# Patient Record
Sex: Female | Born: 1980 | Race: Black or African American | Hispanic: No | Marital: Married | Smoking: Never smoker
Health system: Southern US, Community
[De-identification: ages and names within clinical notes are randomized; demographics above are authoritative.]

## PROBLEM LIST (undated history)

## (undated) DIAGNOSIS — R7303 Prediabetes: Secondary | ICD-10-CM

## (undated) DIAGNOSIS — F419 Anxiety disorder, unspecified: Secondary | ICD-10-CM

## (undated) DIAGNOSIS — D649 Anemia, unspecified: Secondary | ICD-10-CM

## (undated) DIAGNOSIS — G8929 Other chronic pain: Secondary | ICD-10-CM

## (undated) DIAGNOSIS — M542 Cervicalgia: Secondary | ICD-10-CM

## (undated) DIAGNOSIS — I10 Essential (primary) hypertension: Secondary | ICD-10-CM

## (undated) DIAGNOSIS — G4730 Sleep apnea, unspecified: Secondary | ICD-10-CM

## (undated) DIAGNOSIS — M549 Dorsalgia, unspecified: Secondary | ICD-10-CM

## (undated) DIAGNOSIS — M1990 Unspecified osteoarthritis, unspecified site: Secondary | ICD-10-CM

## (undated) HISTORY — PX: BACK SURGERY: SHX140

## (undated) HISTORY — DX: Anemia, unspecified: D64.9

## (undated) HISTORY — DX: Sleep apnea, unspecified: G47.30

## (undated) HISTORY — PX: ABDOMINAL SURGERY: SHX537

---

## 2005-10-07 ENCOUNTER — Inpatient Hospital Stay (HOSPITAL_COMMUNITY): Admission: EM | Admit: 2005-10-07 | Discharge: 2005-10-09 | Payer: Self-pay | Admitting: Emergency Medicine

## 2005-10-07 ENCOUNTER — Ambulatory Visit: Payer: Self-pay | Admitting: Internal Medicine

## 2009-01-01 ENCOUNTER — Emergency Department (HOSPITAL_COMMUNITY): Admission: EM | Admit: 2009-01-01 | Discharge: 2009-01-01 | Payer: Self-pay | Admitting: Emergency Medicine

## 2009-03-06 ENCOUNTER — Encounter (HOSPITAL_COMMUNITY): Admission: RE | Admit: 2009-03-06 | Discharge: 2009-04-05 | Payer: Self-pay | Admitting: Preventative Medicine

## 2009-06-11 ENCOUNTER — Encounter: Admission: RE | Admit: 2009-06-11 | Discharge: 2009-06-11 | Payer: Self-pay | Admitting: Neurosurgery

## 2010-05-22 NOTE — Op Note (Signed)
Stacey Bray            ACCOUNT NO.:  [id number]   MEDICAL RECORD NO.:  [id number]          PATIENT TYPE:  INP   LOCATION:  A213                          FACILITY:  [facility]   PHYSICIAN:  Lionel December, M.D.    DATE OF BIRTH:  1980-08-03   DATE OF PROCEDURE:  10/08/2005  DATE OF DISCHARGE:                                 OPERATIVE REPORT   PROCEDURE:  Esophagogastroduodenoscopy followed by colonoscopy.   INDICATIONS:  Stacey Bray is a 30 year old African American female admitted with  weakness and postural symptoms and noted to be profoundly anemic with a  hemoglobin of 5.8 grams and low indices suspicious for iron deficiency.  She  had gynecologic evaluation by Dr. Emelda Fear.  These exams were negative,  although she has been having heavy periods.  She also has intermittent  hematochezia.  She is undergoing a GI evaluation to make sure she does not  have a GI source to account for her profound anemia, possibly iron  deficiency anemia (her iron studies are pending).   The procedure was reviewed with the patient, and informed consent was  obtained.   MEDICATIONS FOR CONSCIOUS SEDATION:  Benzocaine spray for oropharyngeal  topical anesthesia, Demerol 50 mg IV, Versed 8 mg IV in divided dose.   FINDINGS:  The procedure performed in the endoscopy suite.  The patient's  vital signs and O2 saturations were monitored during the procedure and  remained stable.   Procedure #1.  Esophagogastroduodenoscopy.  The patient was placed in the  left lateral recumbent position, and the Olympus videoscope was passed via  oropharynx without any difficulty into the esophagus.   Esophagus:  The mucosa of the esophagus was normal.  The GE junction was 40  cm from the incisors.   Stomach:  It was empty and distended very well with insufflation.  The folds  of proximal stomach were normal.  Examination of the mucosa at body, antrum,  pyloric channel, as well as angularis, fundus and cardia was  normal.   Duodenum:  The bulbar mucosa was normal.  The scope was passed into the  second part of the duodenum where mucosa and folds were normal.  The  endoscope was withdrawn.  The patient prepared for procedure #2.   Procedure #2.  Colonoscopy.  Rectal examination performed.  No abnormality  noted on external or digital exam.  The Olympus videoscope was placed into  the rectum and advanced under vision into the sigmoid colon and beyond.  The  preparation was excellent.  The scope was passed into the cecum which was  identified by the appendiceal orifice and ileocecal valve.  Pictures were  taken for the record.  As the scope was withdrawn, the colonic mucosa was  carefully examined and was normal throughout.  The rectal mucosa similarly  was normal.  The scope was retroflexed to examine the anorectal junction,  and hemorrhoids were noted below the dentate line.  The endoscope was  straightened and withdrawn.  The patient tolerated the procedure well.   FINAL DIAGNOSES:  1. Normal esophagogastroduodenoscopy.  2. Normal colonoscopy, except for external hemorrhoids which would explain  her intermittent hematochezia.   I suspect her anemia is secondary to chronic uterine blood loss.  Her iron  studies are pending.  Dr. Josefine Class has also requested hemoglobin  electrophoresis which is also pending.   RECOMMENDATIONS:  Presuming she has iron deficiency, she needs to be on  chronic iron therapy at least while she is having her periods.   Further GI evaluation will only be considered if she has documented iron  deficiency anemia which does not correct with iron therapy and/or she has  evidence of occult bleeding.      Lionel December, M.D.  Electronically Signed     NR/MEDQ  D:  10/08/2005  T:  10/10/2005  Job:  [id number]   cc:   Margaretmary Dys, M.D.

## 2010-05-22 NOTE — H&P (Signed)
[name], Bray            ACCOUNT NO.:  [id number]   MEDICAL RECORD NO.:  [id number]          PATIENT TYPE:  INP   LOCATION:  A213                          FACILITY:  [facility]   PHYSICIAN:  Margaretmary Dys, M.D.DATE OF BIRTH:  1980-11-11   DATE OF ADMISSION:  10/07/2005  DATE OF DISCHARGE:  LH                                HISTORY & PHYSICAL   PRIMARY CARE PHYSICIAN:  The patient is unassigned.   ADMITTING DIAGNOSES:  1. Generalized weakness.  2. Dizziness.  3. Severe anemia, hypochromic and microcytic.  4. Morbid obesity.   CHIEF COMPLAINT:  Generalized weakness and dizziness.   HISTORY OF PRESENT ILLNESS:  [name] is a 30 year old African-American  female with no significant past medical history.  She reports that she has  been feeling gradually dizzy over the past 2 days.  It was mostly when she  was standing or walking around.  She also describes symptoms of being  unsteady on her feet and feeling faint.  The patient attributed it to  perhaps just working very heard.  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  She did not  mention any chest pain.  No cough.  No fever or chills.  No night sweats.  No weight loss.  She denies any visual changes.  She has had no headaches at  all or photophobia.  She denies any leg swelling or pain in her lower  extremities.   The patient say her weakness and dizziness did not occur while at rest.  She  denies any blood per rectum or black stools.  She denies any hematemesis or  hemoptysis.  She has had no abdominal pain, nausea, vomiting, or diarrhea.  She is otherwise stable, other than the dizziness which she complained  above.  Evaluation in the emergency room, however, reveals that she was  severely anemic with no clear identifying factor.  She had a pelvic  ultrasound to rule out any vaginal or pelvic pathology, the report of which  is negative.  She also had evaluation by Dr. Emelda Fear who did not think  there was any concerning OB/GYN  issues.   The patient did tell me that over the past 3 months she thinks her menstrual  period has been a little heavier than usual.   She has seen more clots and also has changed her pad multiple times.   She has a 57-month-old baby.   REVIEW OF SYSTEMS:  A 10-point review of systems is otherwise negative with  no chest pain, no shortness of breath.   PAST MEDICAL HISTORY:  None.   MEDICATIONS:  None.   ALLERGIES:  No known drug allergies.   FAMILY HISTORY:  Only positive for hypertension.   SURGICAL HISTORY:  None of note.   SOCIAL HISTORY:  The patient does not smoke or drink alcohol.  Denies any  illicit drug use.  Her last menstrual period was September 22, 2005.  She  has 2 children, a 45 year old and a 11-month-old child.   She has always had a regular normal flow of menses until 3 months ago.   PHYSICAL EXAMINATION:  GENERAL:  Conscious, alert, comfortable, pleasant,  not in acute distress.  VITAL SIGNS:  On arrival here, blood pressure was 147/94, pulse of 86,  respirations 20, temperature 97.4, oxygen saturation was 98% on room air.  The patient weighed 141 kg.  HEENT EXAM:  Normocephalic, atraumatic.  Oral mucosa was moist with no  exudates.  NECK:  Supple.  No JVD or lymphadenopathy.  LUNGS:  Clear clinically with good air entry bilaterally.  HEART:  S1, S2 regular.  No S3, S4, gallops, or rubs.  ABDOMEN:  Obese but soft.  Not tender.  Bowel sounds positive.  No masses  palpable.  EXTREMITIES:  No pitting pedal edema.  No calf induration or tenderness was  noted.  PELVIC EXAM:  This was done by Dr. Emelda Fear.  Kindly refer to his consult  note.   LABORATORY/DIAGNOSTIC DATA:  White blood cell count was 6.1, hemoglobin of  5.8, hematocrit was 19.6, platelet count was 301, with no left shift.  MCV  was 56.7, MCHC was 29.5.  This is consistent with microcytic hypochromic  anemia.   She had a reticulocyte count of 2.3% with absolute reticulocytes of 79.6.   Sodium was 139, potassium 3.5, chloride of 107, CO2 of 27, glucose 95, BUN  of 4, creatinine of 0.8.  AST and ALT were normal.  Her liver function tests  were completely normal.  Albumin was 3.3, calcium of 8.6.  Urine pregnancy  test was negative.  Urinalysis was with some leukocytes and small epithelial  cells, 3 to 6 WBCs, and a few bacteria.  Pelvic ultrasound:  The report is  available electronically; there is a small ovarian cyst about 2.1 cm, and a  questionable tiny hemorrhagic rupture of the cyst on the right ovary about  1.3 cm in diameter.  There were no other pelvic abnormalities.   ASSESSMENT AND PLAN:  [name] is a 30 year old African-American female  who presents to the emergency room with generalized weakness and dizziness  and feeling faint.   Evaluation in the emergency room revealed that she had severe anemia with  hypochromic, microcytic.  The etiology is unclear but it clearly does point  in the direction of iron deficiency anemia, likely from chronic blood loss.   Plan is to admit her to 2A.  We will put her on telemetry.   We will obtain a GI consult as well already obtained an OB/GYN with Dr.  Emelda Fear.  The patient may need an endoscopy/colonoscopy.   We will do an anemia workup on her including a ferritin level, B12, and  folate level.  We will transfuse her with 3 units of packed red blood cells.   If all this preliminary workup is unremarkable including an endoscopy, the  patient may need hematology consult.   I think her symptoms of generalized weakness and dizziness likely are  related to the severe anemia.   Overall the patient is hemodynamically stable.  I have explained the above  plan to her and her fiance, who was present during this interview, and they  both verbalized a full understanding.      Margaretmary Dys, M.D.  Electronically Signed    AM/MEDQ  D:  10/08/2005  T:  10/08/2005  Job:  [id number]

## 2010-05-22 NOTE — Discharge Summary (Signed)
Stacey Bray, Stacey Bray            ACCOUNT NO.:  [id number]   MEDICAL RECORD NO.:  [id number]          PATIENT TYPE:  INP   LOCATION:  A213                          FACILITY:  [facility]   PHYSICIAN:  Margaretmary Dys, M.D.DATE OF BIRTH:  02/11/1980   DATE OF ADMISSION:  10/07/2005  DATE OF DISCHARGE:  10/06/2007LH                                 DISCHARGE SUMMARY   DISCHARGE DATE:  1. Generalized weakness.  2. Dizziness.  3. Severe hypochromic microcytic anemia.  4. Morbid obesity.   DISCHARGE MEDICATIONS:  Ferrous sulfate 325 mg p.o. b.i.d.   DIET:  Regular. The patient advised on weight loss and increased activity.   ACTIVITY:  As tolerated. The patient encouraged to get on weight loss as  mentioned above. No limitations.   SPECIAL INSTRUCTIONS:  The patient advised to call primary care physician if  she develops any melena stools or bright red blood in her stools.   FOLLOWUP:  1. Dr. Emelda Fear in about 3 to 4 weeks due to heavy menses.   CONSULTATIONS:  1. Dr. Karilyn Cota, Gastroenterology.  2. Dr. Emelda Fear, Obstetrics and Gynecology.   PROCEDURE:  EGD with colonoscopy on October 08, 2005. Indications for  procedure was severe anemia. Results of EGD and colonoscopy was completely  normal.   HOSPITAL COURSE:  She is a 30 year old African-American female with no  significant past medical history. She reports progressively generalized  weakness and dizziness, to the point of being unsteady on her feet and  feeling faint on the day of admission. She subsequently presented to the  emergency room where she was found to have severe anemia. The patient had a  pelvic ultrasound which ruled out any vaginal or pelvic pathology. She  reported heavy menstrual period over the past 6 months. The patient was  consulted by Dr. Emelda Fear, who did not think there were any significant OB-  GYN issues at this time. The patient was subsequently admitted to rule out  chronic occult blood loss from  the gastrointestinal tract. The patient was  subsequently seen by GI and she had an EGD and colonoscopy, results as  stated above. Overall, the patient did fairly well during the course of  hospitalization with no complications. She was hemodynamically stable  throughout. She did receive a total of 2 units of packed red blood cells and  her hemoglobin and hematocrit was stable throughout hospitalization. On the  day of discharge, it was 8.8 and 28.8. It was noted that her MCV on  admission was 56.7. There was also concern that the patient may have a  hemoglobinopathy, as her blood smear showed tear drop cells with  microcytosis and anisocytosis. Hemoglobin electrophoresis has been sent,  which is pending at the time of this dictation and will need to be followed  up.   LABORATORY DATA:  On admission white blood cell count 6.2. Hemoglobin and  hematocrit 5.8 and 19.6. Platelet count was 301,000 with no left shift. MCV  was 56.7. MCHC 29.5. She had a reticulocyte count of 2.3%  with absolute  reticulocytes of 79.6, sodium 139, potassium 3.5, chloride 107, CO2 27,  glucose  95, BUN 4, creatinine 0.8 and liver function tests were normal.  Albumin was 3.3, calcium 8.6. Urine pregnancy test was negative. A  urinalysis with some leukocytes and small epithelial cells.   Pelvic ultrasound shows a small ovarian cyst about 2.1 cm and a questionable  tiny hemorrhagic rupture of the right cyst. There were no other pelvic  abnormalities. Colonoscopy results were unremarkable.   DISPOSITION:  The patient is discharged home in satisfactory condition and  will need followup as mentioned above.      Margaretmary Dys, M.D.  Electronically Signed     AM/MEDQ  D:  10/24/2005  T:  10/24/2005  Job:  [id number]   cc:   Tilda Burrow, M.D.  Fax: [phone]

## 2010-05-22 NOTE — Consult Note (Signed)
NAMEJAILYN, LEESON            ACCOUNT NO.:  [id number]   MEDICAL RECORD NO.:  [id number]          PATIENT TYPE:  INP   LOCATION:  A213                          FACILITY:  [facility]   PHYSICIAN:  Lionel December, M.D.    DATE OF BIRTH:  27-Sep-1980   DATE OF CONSULTATION:  10/08/2005  DATE OF DISCHARGE:                                   CONSULTATION   REQUESTING PHYSICIAN:  [facility].   HISTORY OF PRESENT ILLNESS:  The patient is a 30 year old African-American  female who presented to the emergency department yesterday with complaints  of elevated blood pressure noted at work, headache and dizziness.  She  states she feels like she is going to pass out when she exerts herself.  She  thought maybe she was just working too hard.  Upon evaluation, she was found  to have a profound microcytic anemia with hemoglobin of 5.8, hematocrit of  19.6, MCV 56.7.  She underwent evaluation with pelvic ultrasound because of  recent onset of heavy menses for three months' duration.  She was found to  have small left ovarian cyst and questionable tiny hemorrhagic ruptured  right ovarian cyst; otherwise, ultrasound was unremarkable.  She apparently  was evaluated by GYN in the emergency department and it was felt that the  source of her microcytic anemia was not related to GYN sources.  The patient  states that she has a 75-month-old daughter.  At her six-week post-partum  check, she reports that she was anemic and was on iron for several months.  She is not sure if her hemoglobin ever returned to normal, however.  She has  been aware of being anemic in the past, but none quite so severe.  She has  been having regular menses pretty much since she gave birth to her daughter.  The last three months she has been having increased flow with clots, but her  cycles are generally once every 28-30 days.  She also has intermittent  hematochezia with small clots noted, but states it is only occasional and  she  thought it was from her hemorrhoids.  She has intermittent constipation,  denies any diarrhea, no abdominal pain, nausea or vomiting.  She has a  remote history of GERD, but really has not had much problem with this  lately.  No anorexia.  She denies any NSAIDs or aspirin use.  She takes  Tylenol 500 mg as needed for pain.  She was hemoccult negative in the  emergency department.   MEDICATIONS AT HOME:  Tylenol 500 mg p.r.n.Marland Kitchen   ALLERGIES:  She developed hives to Levaquin since admission.   PAST MEDICAL HISTORY:  Negative for chronic illnesses.  She has had some  benign subcutaneous cysts removed from  her abdomen.   FAMILY HISTORY:  Negative for chronic medical illnesses, peptic ulcer  disease, celiac disease, IVD, colorectal cancer.   SOCIAL HISTORY:  She is married.  She has a 32 year old and a 47-month-old.  She works a Event organiser.  She denies any tobacco, alcohol or illicit drug  use.   REVIEW OF SYSTEMS:  GI/GU:  See HPI.  CARDIOPULMONARY:  Denies any chest  pain or shortness of breath.  CONSTITUTIONAL:  See HPI.   PHYSICAL EXAMINATION:  VITAL SIGNS:  Temp 97.6, pulse 79, respirations 19,  blood pressure 160/90.  Weight 304.1 pounds, 67 inches.  GENERAL:  Pleasant, morbidly obese female in no acute distress.  SKIN:  Warm and dry, no jaundice.  HEENT:  Sclerae nonicteric.  Oropharyngeal mucosa moist and pink.  No  lymphadenopathy or thyromegaly.  CHEST:  Lungs clear to auscultation.  CARDIAC:  Regular rate and rhythm.  Normal S1, S2.  No murmurs, rubs or  gallops.  ABDOMEN:  Positive bowel sounds.  Obese but symmetrical.  Soft.  Nontender.  No organomegaly or masses appreciated, but limited due to body habitus.  No  rebound tenderness or guarding.  EXTREMITIES:  No edema.   LABS:  White count 6100, platelets 301,000, hemoglobin 5.8, hematocrit 19.6,  MCV 56.7, BUN 4, creatinine 0.8, potassium 3.5, glucose 95, albumin 3.3,  total bilirubin 23, alkaline phosphatase 84, AST  18, ALT 12, HCG negative.   IMPRESSION:  1. The patient is a 30 year old lady with profound microcytic anemia,      suspected to be due to iron deficiency anemia.  2. Differential diagnosis is quite broad:  She is postpartum 10 months.      She states that she had anemia at her 6-week postpartum checkup and was      on iron for several months.  It is unclear whether she was ever able to      gain a normal hemoglobin.  She does have intermittent hematochezia and      remote gastroesophageal reflux disease.  From a GI standpoint, we need      to exclude possibilities such as polyps, colorectal cancer,      arteriovenous malformations, irritable bowel disease, peptic ulcer      disease, celiac disease.   RECOMMENDATIONS:  Colonoscopy with possible EGD today.      Tana Coast, P.A.      Lionel December, M.D.  Electronically Signed    LL/MEDQ  D:  10/08/2005  T:  10/08/2005  Job:  [id number]

## 2010-06-30 ENCOUNTER — Other Ambulatory Visit (HOSPITAL_COMMUNITY)
Admission: RE | Admit: 2010-06-30 | Discharge: 2010-06-30 | Disposition: A | Discharge status: Home / Self Care | Payer: Self-pay | Source: Ambulatory Visit | Attending: Family Medicine | Admitting: Family Medicine

## 2010-06-30 ENCOUNTER — Other Ambulatory Visit (HOSPITAL_COMMUNITY)
Admission: RE | Admit: 2010-06-30 | Discharge: 2010-06-30 | Disposition: A | Discharge status: Home / Self Care | Payer: Self-pay | Source: Ambulatory Visit | Attending: Unknown Physician Specialty | Admitting: Unknown Physician Specialty

## 2010-06-30 ENCOUNTER — Other Ambulatory Visit: Payer: Self-pay | Admitting: Nurse Practitioner

## 2010-06-30 DIAGNOSIS — R87610 Atypical squamous cells of undetermined significance on cytologic smear of cervix (ASC-US): Secondary | ICD-10-CM | POA: Insufficient documentation

## 2010-06-30 DIAGNOSIS — R87619 Unspecified abnormal cytological findings in specimens from cervix uteri: Secondary | ICD-10-CM | POA: Insufficient documentation

## 2012-10-10 ENCOUNTER — Emergency Department (HOSPITAL_COMMUNITY): Payer: Self-pay

## 2012-10-10 ENCOUNTER — Encounter (HOSPITAL_COMMUNITY): Payer: Self-pay | Admitting: *Deleted

## 2012-10-10 ENCOUNTER — Emergency Department (HOSPITAL_COMMUNITY)
Admission: EM | Admit: 2012-10-10 | Discharge: 2012-10-10 | Disposition: A | Discharge status: Home / Self Care | Payer: Self-pay | Attending: Emergency Medicine | Admitting: Emergency Medicine

## 2012-10-10 DIAGNOSIS — M545 Low back pain, unspecified: Secondary | ICD-10-CM | POA: Insufficient documentation

## 2012-10-10 DIAGNOSIS — M542 Cervicalgia: Secondary | ICD-10-CM | POA: Insufficient documentation

## 2012-10-10 DIAGNOSIS — Z87828 Personal history of other (healed) physical injury and trauma: Secondary | ICD-10-CM | POA: Insufficient documentation

## 2012-10-10 DIAGNOSIS — Z9889 Other specified postprocedural states: Secondary | ICD-10-CM | POA: Insufficient documentation

## 2012-10-10 DIAGNOSIS — I10 Essential (primary) hypertension: Secondary | ICD-10-CM | POA: Insufficient documentation

## 2012-10-10 DIAGNOSIS — M5137 Other intervertebral disc degeneration, lumbosacral region: Secondary | ICD-10-CM | POA: Insufficient documentation

## 2012-10-10 DIAGNOSIS — M5136 Other intervertebral disc degeneration, lumbar region: Secondary | ICD-10-CM

## 2012-10-10 DIAGNOSIS — G8929 Other chronic pain: Secondary | ICD-10-CM | POA: Insufficient documentation

## 2012-10-10 DIAGNOSIS — M549 Dorsalgia, unspecified: Secondary | ICD-10-CM

## 2012-10-10 DIAGNOSIS — M51379 Other intervertebral disc degeneration, lumbosacral region without mention of lumbar back pain or lower extremity pain: Secondary | ICD-10-CM | POA: Insufficient documentation

## 2012-10-10 DIAGNOSIS — Z3202 Encounter for pregnancy test, result negative: Secondary | ICD-10-CM | POA: Insufficient documentation

## 2012-10-10 HISTORY — DX: Cervicalgia: M54.2

## 2012-10-10 HISTORY — DX: Essential (primary) hypertension: I10

## 2012-10-10 HISTORY — DX: Dorsalgia, unspecified: M54.9

## 2012-10-10 HISTORY — DX: Other chronic pain: G89.29

## 2012-10-10 MED ORDER — DIAZEPAM 5 MG/ML IJ SOLN
10.0000 mg | Freq: Once | INTRAMUSCULAR | Status: AC
  Administered 2012-10-10: 10 mg via INTRAMUSCULAR
  Filled 2012-10-10: qty 2

## 2012-10-10 MED ORDER — OXYCODONE-ACETAMINOPHEN 5-325 MG PO TABS: 1.0000 | ORAL_TABLET | ORAL | Status: DC | PRN

## 2012-10-10 MED ORDER — ONDANSETRON 8 MG PO TBDP
8.0000 mg | ORAL_TABLET | Freq: Once | ORAL | Status: AC
Start: 2012-10-10 — End: 2012-10-10
  Administered 2012-10-10: 8 mg via ORAL
  Filled 2012-10-10: qty 1

## 2012-10-10 MED ORDER — DIAZEPAM 10 MG PO TABS: 10.0000 mg | ORAL_TABLET | Freq: Four times a day (QID) | ORAL | Status: DC | PRN

## 2012-10-10 MED ORDER — HYDROMORPHONE HCL PF 2 MG/ML IJ SOLN
2.0000 mg | Freq: Once | INTRAMUSCULAR | Status: AC
  Administered 2012-10-10: 2 mg via INTRAMUSCULAR
  Filled 2012-10-10: qty 1

## 2012-10-10 NOTE — ED Notes (Signed)
Back pain from cervical region to lumbar worsening after awakening.  Has chronic back pain, but this is worse and radiates down both legs.  Hurts w/defication, no problems w/urination.  Denies fever, denies any injury.

## 2012-10-10 NOTE — ED Notes (Signed)
Discharge instructions given and reviewed with patient and boyfriend.  Prescriptions given for Percocet and Valium; effects and use explained.  Patient and significant other verbalized understanding of sedating effects of medications and to follow up with PMD as needed.  Patient discharged home in good condition; states pain is worse with movement.

## 2012-10-10 NOTE — ED Provider Notes (Signed)
CSN: [id number]     Arrival date & time 10/10/12  1652 History  This chart was scribed for Flint Melter, MD by Bennett Scrape, ED Scribe. This patient was seen in room APA14/AP[facility]14 and the patient's care was started at 6:39 PM.   Chief Complaint  Patient presents with  . Back Pain    The history is provided by the patient. No language interpreter was used.    HPI Comments: Stacey Bray is a 32 y.o. female who presents to the Emergency Department complaining of chronic back pain including her neck that flared up this morning. She reports that the back pain is felt diffusely and more severe than the neck pain currently. She states that the pain radiates down bilateral legs but is worse on the left side. She describes the pain as shooting and states that it goes past the knees. She rates her pain a 10 out of 10 currently. She admits that she has pain daily which she treats with pain medication, heating pads and OTC medications. She states that she also tries alternating sitting and standing to improve her symptoms. She reports that ambulation and twisting movements aggravate the pain. She reports a prior neck surgery 2 years done by Dr. Gerlene Fee in [place]. She denies chronic neck pain since the surgery. She denies seeing a spine specialist or other specialist for her back pain. She admits that she had x-rays of her lower back done 2 years ago at Dr. Trudee Grip office and x-rays at [facility] 1.5 years ago that showed DDD. 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  She denies bowel or urinary incontinence, other urinary symptoms or changes in her appetite. She denies taking any prescribed daily medications.  No PCP  Past Medical History  Diagnosis Date  . Hypertension   . Chronic neck pain   . Chronic back pain    Past Surgical History  Procedure Laterality Date  . Back surgery      cervical spine  . Abdominal surgery     History reviewed. No  pertinent family history. History  Substance Use Topics  . Smoking status: Never Smoker   . Smokeless tobacco: Not on file  . Alcohol Use: No   No OB history provided.  Review of Systems  Constitutional: Negative for appetite change.  Gastrointestinal:       No bowel incontinence   Genitourinary:       No urinary incontinence   Musculoskeletal: Positive for back pain (chronic).  Neurological: Negative for weakness and numbness.  All other systems reviewed and are negative.    Allergies  Review of patient's allergies indicates no known allergies.  Home Medications   Current Outpatient Rx  Name  Route  Sig  Dispense  Refill  . diazepam (VALIUM) 10 MG tablet   Oral   Take 1 tablet (10 mg total) by mouth every 6 (six) hours as needed (muscle spasm).   20 tablet   0   . oxyCODONE-acetaminophen (PERCOCET) 5-325 MG per tablet   Oral   Take 1 tablet by mouth every 4 (four) hours as needed for pain.   20 tablet   0     Triage Vitals: BP 157/101  Pulse 92  Temp(Src) 98.6 F (37 C) (Oral)  Resp 18  Ht 5\' 5"  (1.651 m)  Wt 298 lb (135.172 kg)  BMI 49.59 kg/m2  SpO2 99%  LMP 09/05/2012  Physical Exam  Nursing note and  vitals reviewed. Constitutional: She is oriented to person, place, and time. She appears well-developed and well-nourished.  HENT:  Head: Normocephalic and atraumatic.  Right Ear: External ear normal.  Left Ear: External ear normal.  Eyes: Conjunctivae and EOM are normal. Pupils are equal, round, and reactive to light.  Neck: Normal range of motion and phonation normal. Neck supple.  Cardiovascular: Normal rate, regular rhythm, normal heart sounds and intact distal pulses.   Pulmonary/Chest: Effort normal and breath sounds normal. She exhibits no bony tenderness.  Abdominal: Soft. Normal appearance. There is no tenderness.  Musculoskeletal: Normal range of motion.  Diffuse tenderness to entire spine and paraspinous muscles  Neurological: She is alert  and oriented to person, place, and time. No cranial nerve deficit or sensory deficit. She exhibits normal muscle tone. Coordination normal.  Skin: Skin is warm, dry and intact.  Psychiatric: She has a normal mood and affect. Her behavior is normal. Judgment and thought content normal.    ED Course  Procedures (including critical care time)  Medications  HYDROmorphone (DILAUDID) injection 2 mg (2 mg Intramuscular Given 10/10/12 1929)  diazepam (VALIUM) injection 10 mg (10 mg Intramuscular Given 10/10/12 1925)  ondansetron (ZOFRAN-ODT) disintegrating tablet 8 mg (8 mg Oral Given 10/10/12 1923)    Patient Vitals for the past 24 hrs:  BP Temp Temp src Pulse Resp SpO2 Height Weight  10/10/12 1709 157/101 mmHg 98.6 F (37 C) Oral 92 18 99 % 5\' 5"  (1.651 m) 298 lb (135.172 kg)    DIAGNOSTIC STUDIES: Oxygen Saturation is 99% on room air, normal by my interpretation.    COORDINATION OF CARE: 6:46 PM-Discussed treatment plan which includes medications and x-rays of the lumbar and thoracic spine with pt at bedside and pt agreed to plan.   8:55 PM-Pt rechecked and is sleeping soundly. She states that she feels improved with medications listed above. Informed pt of radiology results showing mild arthritis. Discussed discharge plan which includes medications with pt and pt agreed to plan. Also advised pt to follow up with a specialist as needed and pt agreed. Addressed symptoms to return for with pt.   Results for orders placed during the hospital encounter of 10/10/12  POCT PREGNANCY, URINE      Result Value Range   Preg Test, Ur NEGATIVE  NEGATIVE   Dg Thoracic Spine W/swimmers  10/10/2012   CLINICAL DATA:  Back pain.  EXAM: THORACIC SPINE - 2 VIEW + SWIMMERS  COMPARISON:  None.  FINDINGS: Lower cervical plate and screw fixator observed. Thoracic spondylosis is present with multilevel spurring anterior to the vertebral body column. No thoracic spine fracture or acute subluxation is observed.   IMPRESSION: 1. Thoracic spondylosis without radiographic findings of fracture or acute subluxation.   Electronically Signed   By: Herbie Baltimore M.D.   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 There is L5 spina bifida occulta.  Reduced intervertebral disc height at L5-S1, with spurring anterior to the vertebral body column at this level. No malalignment or lumbar spine fracture.  IMPRESSION: 1. Degenerative disk disease and spondylosis at L5-S1. 2. L5 spina bifida occulta. 3. 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   Electronically Signed   By: Herbie Baltimore M.D.   On: 10/10/2012 20:20     MDM   1. Back pain   2. Degenerative disc  disease, lumbar    Nonspecific back pain. She has minimal degenerative disc disease would only explain lower back pain. She is improved with treatment in the emergency department, in stable for discharge.  Nursing Notes Reviewed/ Care Coordinated, and agree without changes. Applicable Imaging Reviewed.  Interpretation of Laboratory Data incorporated into ED treatment   Plan: Home Medications- Percocet, Valium; Home Treatments and Observation- rest, heat; return here if the recommended treatment, does not improve the symptoms; Recommended follow up- PCP, when necessary    I personally performed the services described in this documentation, which was scribed in my presence. The recorded information has been reviewed and is accurate.      Flint Melter, MD 10/11/12 0010

## 2013-11-22 DIAGNOSIS — R7303 Prediabetes: Secondary | ICD-10-CM | POA: Insufficient documentation

## 2013-11-22 DIAGNOSIS — I10 Essential (primary) hypertension: Secondary | ICD-10-CM | POA: Insufficient documentation

## 2013-11-22 DIAGNOSIS — L732 Hidradenitis suppurativa: Secondary | ICD-10-CM | POA: Insufficient documentation

## 2014-01-04 HISTORY — PX: LAPAROSCOPIC GASTRIC SLEEVE RESECTION: SHX5895

## 2014-04-19 DIAGNOSIS — G4730 Sleep apnea, unspecified: Secondary | ICD-10-CM | POA: Insufficient documentation

## 2014-10-27 ENCOUNTER — Emergency Department (HOSPITAL_COMMUNITY)
Admission: EM | Admit: 2014-10-27 | Discharge: 2014-10-27 | Disposition: A | Discharge status: Home / Self Care | Payer: Medicaid Other | Attending: Emergency Medicine | Admitting: Emergency Medicine

## 2014-10-27 DIAGNOSIS — I10 Essential (primary) hypertension: Secondary | ICD-10-CM | POA: Diagnosis not present

## 2014-10-27 DIAGNOSIS — G8929 Other chronic pain: Secondary | ICD-10-CM | POA: Insufficient documentation

## 2014-10-27 DIAGNOSIS — H81399 Other peripheral vertigo, unspecified ear: Secondary | ICD-10-CM | POA: Diagnosis not present

## 2014-10-27 DIAGNOSIS — R42 Dizziness and giddiness: Secondary | ICD-10-CM | POA: Diagnosis present

## 2014-10-27 LAB — BASIC METABOLIC PANEL
ANION GAP: 7 (ref 5–15)
BUN: 9 mg/dL (ref 6–20)
CALCIUM: 9 mg/dL (ref 8.9–10.3)
CO2: 29 mmol/L (ref 22–32)
Chloride: 103 mmol/L (ref 101–111)
Creatinine, Ser: 0.98 mg/dL (ref 0.44–1.00)
Glucose, Bld: 91 mg/dL (ref 65–99)
Potassium: 3.4 mmol/L — ABNORMAL LOW (ref 3.5–5.1)
SODIUM: 139 mmol/L (ref 135–145)

## 2014-10-27 LAB — CBC WITH DIFFERENTIAL/PLATELET
Basophils Absolute: 0 K/uL (ref 0.0–0.1)
Basophils Relative: 0 %
Eosinophils Absolute: 0.1 K/uL (ref 0.0–0.7)
Eosinophils Relative: 2 %
HCT: 34.3 % — ABNORMAL LOW (ref 36.0–46.0)
Hemoglobin: 11.2 g/dL — ABNORMAL LOW (ref 12.0–15.0)
Lymphocytes Relative: 23 %
Lymphs Abs: 1.6 K/uL (ref 0.7–4.0)
MCH: 25.7 pg — ABNORMAL LOW (ref 26.0–34.0)
MCHC: 32.7 g/dL (ref 30.0–36.0)
MCV: 78.7 fL (ref 78.0–100.0)
Monocytes Absolute: 0.3 K/uL (ref 0.1–1.0)
Monocytes Relative: 5 %
Neutro Abs: 4.6 K/uL (ref 1.7–7.7)
Neutrophils Relative %: 70 %
Platelets: 162 K/uL (ref 150–400)
RBC: 4.36 MIL/uL (ref 3.87–5.11)
RDW: 16 % — ABNORMAL HIGH (ref 11.5–15.5)
WBC: 6.7 K/uL (ref 4.0–10.5)

## 2014-10-27 MED ORDER — LORAZEPAM 1 MG PO TABS: 1.0000 mg | ORAL_TABLET | Freq: Three times a day (TID) | ORAL | Status: DC | PRN

## 2014-10-27 MED ORDER — MECLIZINE HCL 25 MG PO TABS: 25.0000 mg | ORAL_TABLET | Freq: Three times a day (TID) | ORAL | Status: DC | PRN

## 2014-10-27 MED ORDER — MECLIZINE HCL 25 MG PO TABS
25.0000 mg | ORAL_TABLET | Freq: Once | ORAL | Status: AC
  Administered 2014-10-27: 25 mg via ORAL
  Filled 2014-10-27: qty 1

## 2014-10-27 MED ORDER — LORAZEPAM 1 MG PO TABS
1.0000 mg | ORAL_TABLET | Freq: Once | ORAL | Status: AC
  Administered 2014-10-27: 1 mg via ORAL
  Filled 2014-10-27: qty 1

## 2014-10-27 NOTE — Discharge Instructions (Signed)
Vertigo Vertigo means you feel like you or your surroundings are moving when they are not. Vertigo can be dangerous if it occurs when you are at work, driving, or performing difficult activities.  CAUSES  Vertigo occurs when there is a conflict of signals sent to your brain from the visual and sensory systems in your body. There are many different causes of vertigo, including:  Infections, especially in the inner ear.  A bad reaction to a drug or misuse of alcohol and medicines.  Withdrawal from drugs or alcohol.  Rapidly changing positions, such as lying down or rolling over in bed.  A migraine headache.  Decreased blood flow to the brain.  Increased pressure in the brain from a head injury, infection, tumor, or bleeding. SYMPTOMS  You may feel as though the world is spinning around or you are falling to the ground. Because your balance is upset, vertigo can cause nausea and vomiting. You may have involuntary eye movements (nystagmus). DIAGNOSIS  Vertigo is usually diagnosed by physical exam. If the cause of your vertigo is unknown, your caregiver may perform imaging tests, such as an MRI scan (magnetic resonance imaging). TREATMENT  Most cases of vertigo resolve on their own, without treatment. Depending on the cause, your caregiver may prescribe certain medicines. If your vertigo is related to body position issues, your caregiver may recommend movements or procedures to correct the problem. In rare cases, if your vertigo is caused by certain inner ear problems, you may need surgery. HOME CARE INSTRUCTIONS   Follow your caregiver's instructions.  Avoid driving.  Avoid operating heavy machinery.  Avoid performing any tasks that would be dangerous to you or others during a vertigo episode.  Tell your caregiver if you notice that certain medicines seem to be causing your vertigo. Some of the medicines used to treat vertigo episodes can actually make them worse in some people. SEEK  IMMEDIATE MEDICAL CARE IF:   Your medicines do not relieve your vertigo or are making it worse.  You develop problems with talking, walking, weakness, or using your arms, hands, or legs.  You develop severe headaches.  Your nausea or vomiting continues or gets worse.  You develop visual changes.  A family member notices behavioral changes.  Your condition gets worse. MAKE SURE YOU:  Understand these instructions.  Will watch your condition.  Will get help right away if you are not doing well or get worse.   This information is not intended to replace advice given to you by your health care provider. Make sure you discuss any questions you have with your health care provider.   Document Released: 09/30/2004 Document Revised: 03/15/2011 Document Reviewed: 04/15/2014 Elsevier Interactive Patient Education 2016 [place] tablets or capsules What is this medicine? MECLIZINE (MEK li zeen) is an antihistamine. It is used to prevent nausea, vomiting, or dizziness caused by motion sickness. It is also used to prevent and treat vertigo (extreme dizziness or a feeling that you or your surroundings are tilting or spinning around). This medicine may be used for other purposes; ask your health care provider or pharmacist if you have questions. What should I tell my health care provider before I take this medicine? They need to know if you have any of these conditions: -glaucoma -lung or breathing disease, like asthma -problems urinating -prostate disease -stomach or intestine problems -an unusual or allergic reaction to meclizine, other medicines, foods, dyes, or preservatives -pregnant or trying to get pregnant -breast-feeding How should I  use this medicine? °Take this medicine by mouth with a glass of water. Follow the directions on the prescription label. If you are using this medicine to prevent motion sickness, take the dose at least 1 hour before travel. If it upsets  your stomach, take it with food or milk. Take your doses at regular intervals. Do not take your medicine more often than directed. °Talk to your pediatrician regarding the use of this medicine in children. Special care may be needed. °Overdosage: If you think you have taken too much of this medicine contact a poison control center or emergency room at once. °NOTE: This medicine is only for you. Do not share this medicine with others. °What if I miss a dose? °If you miss a dose, take it as soon as you can. If it is almost time for your next dose, take only that dose. Do not take double or extra doses. °What may interact with this medicine? °Do not take this medicine with any of the following medications: °-MAOIs like Carbex, Eldepryl, Marplan, Nardil, and Parnate °This medicine may also interact with the following medications: °-alcohol °-antihistamines for allergy, cough and cold °-certain medicines for anxiety or sleep °-certain medicines for depression, like amitriptyline, fluoxetine, sertraline °-certain medicines for seizures like phenobarbital, primidone °-general anesthetics like halothane, isoflurane, methoxyflurane, propofol °-local anesthetics like lidocaine, pramoxine, tetracaine °-medicines that relax muscles for surgery °-narcotic medicines for pain °-phenothiazines like chlorpromazine, mesoridazine, prochlorperazine, thioridazine °This list may not describe all possible interactions. Give your health care provider a list of all the medicines, herbs, non-prescription drugs, or dietary supplements you use. Also tell them if you smoke, drink alcohol, or use illegal drugs. Some items may interact with your medicine. °What should I watch for while using this medicine? °Tell your doctor or healthcare professional if your symptoms do not start to get better or if they get worse. °You may get drowsy or dizzy. Do not drive, use machinery, or do anything that needs mental alertness until you know how this  medicine affects you. Do not stand or sit up quickly, especially if you are an older patient. This reduces the risk of dizzy or fainting spells. Alcohol may interfere with the effect of this medicine. Avoid alcoholic drinks. °Your mouth may get dry. Chewing sugarless gum or sucking hard candy, and drinking plenty of water may help. Contact your doctor if the problem does not go away or is severe. °This medicine may cause dry eyes and blurred vision. If you wear contact lenses you may feel some discomfort. Lubricating drops may help. See your eye doctor if the problem does not go away or is severe. °What side effects may I notice from receiving this medicine? °Side effects that you should report to your doctor or health care professional as soon as possible: °-feeling faint or lightheaded, falls °-fast, irregular heartbeat °Side effects that usually do not require medical attention (report these to your doctor or health care professional if they continue or are bothersome): °-constipation °-headache °-trouble passing urine or change in the amount of urine °-trouble sleeping °-upset stomach °This list may not describe all possible side effects. Call your doctor for medical advice about side effects. You may report side effects to FDA at 1-800-FDA-1088. °Where should I keep my medicine? °Keep out of the reach of children. °Store at room temperature between 15 and 30 degrees C (59 and 86 degrees F). Keep container tightly closed. Throw away any unused medicine after the expiration date. °NOTE: This sheet   is a summary. It may not cover all possible information. If you have questions about this medicine, talk to your doctor, pharmacist, or health care provider.    2016, Elsevier/Gold Standard. (2014-06-27 09:20:57)  Lorazepam tablets What is this medicine? LORAZEPAM (lor A ze pam) is a benzodiazepine. It is used to treat anxiety. This medicine may be used for other purposes; ask your health care provider or  pharmacist if you have questions. What should I tell my health care provider before I take this medicine? They need to know if you have any of these conditions: -alcohol or drug abuse problem -bipolar disorder, depression, psychosis or other mental health condition -glaucoma -kidney or liver disease -lung disease or breathing difficulties -myasthenia gravis -Parkinson's disease -seizures or a history of seizures -suicidal thoughts -an unusual or allergic reaction to lorazepam, other benzodiazepines, foods, dyes, or preservatives -pregnant or trying to get pregnant -breast-feeding How should I use this medicine? Take this medicine by mouth with a glass of water. Follow the directions on the prescription label. If it upsets your stomach, take it with food or milk. Take your medicine at regular intervals. Do not take it more often than directed. Do not stop taking except on the advice of your doctor or health care professional. Talk to your pediatrician regarding the use of this medicine in children. Special care may be needed. Overdosage: If you think you have taken too much of this medicine contact a poison control center or emergency room at once. NOTE: This medicine is only for you. Do not share this medicine with others. What if I miss a dose? If you miss a dose, take it as soon as you can. If it is almost time for your next dose, take only that dose. Do not take double or extra doses. What may interact with this medicine? -barbiturate medicines for inducing sleep or treating seizures, like phenobarbital -clozapine -medicines for depression, mental problems or psychiatric disturbances -medicines for sleep -phenytoin -probenecid -theophylline -valproic acid This list may not describe all possible interactions. Give your health care provider a list of all the medicines, herbs, non-prescription drugs, or dietary supplements you use. Also tell them if you smoke, drink alcohol, or use  illegal drugs. Some items may interact with your medicine. What should I watch for while using this medicine? Visit your doctor or health care professional for regular checks on your progress. Your body may become dependent on this medicine, ask your doctor or health care professional if you still need to take it. However, if you have been taking this medicine regularly for some time, do not suddenly stop taking it. You must gradually reduce the dose or you may get severe side effects. Ask your doctor or health care professional for advice before increasing or decreasing the dose. Even after you stop taking this medicine it can still affect your body for several days. You may get drowsy or dizzy. Do not drive, use machinery, or do anything that needs mental alertness until you know how this medicine affects you. To reduce the risk of dizzy and fainting spells, do not stand or sit up quickly, especially if you are an older patient. Alcohol may increase dizziness and drowsiness. Avoid alcoholic drinks. Do not treat yourself for coughs, colds or allergies without asking your doctor or health care professional for advice. Some ingredients can increase possible side effects. What side effects may I notice from receiving this medicine? Side effects that you should report to your doctor or health  care professional as soon as possible: -changes in vision -confusion -depression -mood changes, excitability or aggressive behavior -movement difficulty, staggering or jerky movements -muscle cramps -restlessness -weakness or tiredness Side effects that usually do not require medical attention (report to your doctor or health care professional if they continue or are bothersome): -constipation or diarrhea -difficulty sleeping, nightmares -dizziness, drowsiness -headache -nausea, vomiting This list may not describe all possible side effects. Call your doctor for medical advice about side effects. You may report  side effects to FDA at 1-800-FDA-1088. Where should I keep my medicine? Keep out of the reach of children. This medicine can be abused. Keep your medicine in a safe place to protect it from theft. Do not share this medicine with anyone. Selling or giving away this medicine is dangerous and against the law. This medicine may cause accidental overdose and death if taken by other adults, children, or pets. Mix any unused medicine with a substance like cat litter or coffee grounds. Then throw the medicine away in a sealed container like a sealed bag or a coffee can with a lid. Do not use the medicine after the expiration date. Store at room temperature between 20 and 25 degrees C (68 and 77 degrees F). Protect from light. Keep container tightly closed. NOTE: This sheet is a summary. It may not cover all possible information. If you have questions about this medicine, talk to your doctor, pharmacist, or health care provider.    2016, Elsevier/Gold Standard. (2013-09-11 15:24:21)

## 2014-10-27 NOTE — ED Notes (Signed)
States I was getting up for the morning.  Instantly felt dizzy when I sat up.  It felt like everything was turning and I fell back on the bed.  Still feels dizzy.

## 2014-10-27 NOTE — ED Notes (Signed)
MD at bedside. 

## 2014-10-27 NOTE — ED Provider Notes (Signed)
CSN: [id number]     Arrival date & time 10/27/14  4098 History   First MD Initiated Contact with Patient 10/27/14 0540     Chief Complaint  Patient presents with  . Dizziness     (Consider location/radiation/quality/duration/timing/severity/associated sxs/prior Treatment) Patient is a 34 y.o. female presenting with dizziness. The history is provided by the patient.  Dizziness She became dizzy and she woke up this morning and sat up. She fell back onto the bed but still feels dizzy. Dizziness is described as a spinning sensation. Nothing seems to make it better nothing seems to make it worse. There is no associated nausea. She denies ear pain or tinnitus or hearing loss. She's never had symptoms like this before.  Past Medical History  Diagnosis Date  . Hypertension   . Chronic neck pain   . Chronic back pain    Past Surgical History  Procedure Laterality Date  . Back surgery      cervical spine  . Abdominal surgery     No family history on file. Social History  Substance Use Topics  . Smoking status: Never Smoker   . Smokeless tobacco: Not on file  . Alcohol Use: No   OB History    No data available     Review of Systems  Neurological: Positive for dizziness.  All other systems reviewed and are negative.     Allergies  Review of patient's allergies indicates no known allergies.  Home Medications   Prior to Admission medications   Medication Sig Start Date End Date Taking? Authorizing Provider  diazepam (VALIUM) 10 MG tablet Take 1 tablet (10 mg total) by mouth every 6 (six) hours as needed (muscle spasm). 10/10/12   Mancel Bale, MD  oxyCODONE-acetaminophen (PERCOCET) 5-325 MG per tablet Take 1 tablet by mouth every 4 (four) hours as needed for pain. 10/10/12   Mancel Bale, MD   BP 184/89 mmHg  Pulse 66  Temp(Src) 98 F (36.7 C) (Oral)  Resp 17  Ht  (1.702 m)  Wt 270 lb (122.471 kg)  BMI 42.28 kg/m2  SpO2 99% Physical Exam  Nursing note and  vitals reviewed.  34 year old female, resting comfortably and in no acute distress. Vital signs are significant for hypertension. Oxygen saturation is 00%, which is normal. Head is normocephalic and atraumatic. PERRLA, EOMI. Oropharynx is clear. Neck is nontender and supple without adenopathy or JVD. Back is nontender and there is no CVA tenderness. Lungs are clear without rales, wheezes, or rhonchi. Chest is nontender. Heart has regular rate and rhythm without murmur. Abdomen is soft, flat, nontender without masses or hepatosplenomegaly and peristalsis is normoactive. Extremities have no cyanosis or edema, full range of motion is present. Skin is warm and dry without rash. Neurologic: Mental status is normal, cranial nerves are intact, there are no motor or sensory deficits. There is no nystagmus. Dizziness is reproduced by passive head movement.  ED Course  Procedures (including critical care time) Labs Review Results for orders placed or performed during the hospital encounter of 10/27/14  CBC with Differential  Result Value Ref Range   WBC 6.7 4.0 - 10.5 K/uL   RBC 4.36 3.87 - 5.11 MIL/uL   Hemoglobin 11.2 (L) 12.0 - 15.0 g/dL   HCT 11.9 (L) 14.7 - 82.9 %   MCV 78.7 78.0 - 100.0 fL   MCH 25.7 (L) 26.0 - 34.0 pg   MCHC 32.7 30.0 - 36.0 g/dL   RDW 56.2 (H) 13.0 - 86.5 %  Platelets PENDING 150 - 400 K/uL   Neutrophils Relative % 70 %   Neutro Abs 4.6 1.7 - 7.7 K/uL   Lymphocytes Relative 23 %   Lymphs Abs 1.6 0.7 - 4.0 K/uL   Monocytes Relative 5 %   Monocytes Absolute 0.3 0.1 - 1.0 K/uL   Eosinophils Relative 2 %   Eosinophils Absolute 0.1 0.0 - 0.7 K/uL   Basophils Relative 0 %   Basophils Absolute 0.0 0.0 - 0.1 K/uL  Basic metabolic panel  Result Value Ref Range   Sodium 139 135 - 145 mmol/L   Potassium 3.4 (L) 3.5 - 5.1 mmol/L   Chloride 103 101 - 111 mmol/L   CO2 29 22 - 32 mmol/L   Glucose, Bld 91 65 - 99 mg/dL   BUN 9 6 - 20 mg/dL   Creatinine, Ser 0.450.98 0.44 -  1.00 mg/dL   Calcium 9.0 8.9 - 40.910.3 mg/dL   GFR calc non Af Amer >60 >60 mL/min   GFR calc Af Amer >60 >60 mL/min   Anion gap 7 5 - 15   I have personally reviewed and evaluated these images and lab results as part of my medical decision-making.   MDM   Final diagnoses:  Peripheral vertigo, unspecified laterality    Dizziness which seems most consistent with peripheral vertigo. Screening labs are obtained and she will be given a dose of meclizine. Old records are reviewed, and there are no relevant past visits.  Following meclizine, there was modest improvement in her vertigo. Screening labs are unremarkable. She is discharged with prescription for meclizine and also for lorazepam. Return should symptoms worsen.  Stacey Bray Cynthya Yam, MD 10/27/14 [phone]

## 2014-10-30 DIAGNOSIS — L723 Sebaceous cyst: Secondary | ICD-10-CM | POA: Insufficient documentation

## 2014-10-30 DIAGNOSIS — Z9884 Bariatric surgery status: Secondary | ICD-10-CM | POA: Insufficient documentation

## 2016-12-18 ENCOUNTER — Emergency Department (HOSPITAL_COMMUNITY)
Admission: EM | Admit: 2016-12-18 | Discharge: 2016-12-18 | Disposition: A | Discharge status: Home / Self Care | Payer: Self-pay | Attending: Emergency Medicine | Admitting: Emergency Medicine

## 2016-12-18 ENCOUNTER — Emergency Department (HOSPITAL_COMMUNITY): Payer: Self-pay

## 2016-12-18 ENCOUNTER — Encounter (HOSPITAL_COMMUNITY): Payer: Self-pay

## 2016-12-18 DIAGNOSIS — Y9301 Activity, walking, marching and hiking: Secondary | ICD-10-CM | POA: Insufficient documentation

## 2016-12-18 DIAGNOSIS — W010XXA Fall on same level from slipping, tripping and stumbling without subsequent striking against object, initial encounter: Secondary | ICD-10-CM | POA: Insufficient documentation

## 2016-12-18 DIAGNOSIS — Y92481 Parking lot as the place of occurrence of the external cause: Secondary | ICD-10-CM | POA: Insufficient documentation

## 2016-12-18 DIAGNOSIS — Y998 Other external cause status: Secondary | ICD-10-CM | POA: Insufficient documentation

## 2016-12-18 DIAGNOSIS — S93402A Sprain of unspecified ligament of left ankle, initial encounter: Secondary | ICD-10-CM | POA: Insufficient documentation

## 2016-12-18 DIAGNOSIS — I10 Essential (primary) hypertension: Secondary | ICD-10-CM | POA: Insufficient documentation

## 2016-12-18 DIAGNOSIS — S83402A Sprain of unspecified collateral ligament of left knee, initial encounter: Secondary | ICD-10-CM | POA: Insufficient documentation

## 2016-12-18 DIAGNOSIS — S8992XA Unspecified injury of left lower leg, initial encounter: Secondary | ICD-10-CM | POA: Insufficient documentation

## 2016-12-18 MED ORDER — ACETAMINOPHEN 325 MG PO TABS
650.0000 mg | ORAL_TABLET | Freq: Once | ORAL | Status: AC
  Administered 2016-12-18: 650 mg via ORAL
  Filled 2016-12-18: qty 2

## 2016-12-18 MED ORDER — CYCLOBENZAPRINE HCL 10 MG PO TABS: 10.0000 mg | ORAL_TABLET | Freq: Two times a day (BID) | ORAL | 0 refills | Status: DC | PRN

## 2016-12-18 MED ORDER — ACETAMINOPHEN 500 MG PO TABS: 500.0000 mg | ORAL_TABLET | Freq: Four times a day (QID) | ORAL | 0 refills | Status: AC | PRN

## 2016-12-18 NOTE — ED Triage Notes (Signed)
Patient fell out on snow in yard today and complains of left knee and ankle pain, pain severe with any ambulation. No loc. Alert and oriented

## 2016-12-18 NOTE — ED Provider Notes (Signed)
[facility]   CSN: [id number] Arrival date & time: 12/18/16  1623     History   Chief Complaint Chief Complaint  Patient presents with  . fall/ knee and ankle injury    HPI Stacey Bray is a 36 y.o. female.  HPI   36 year old female with history of chronic pain presenting for evaluation of a recent fall.  Patient states prior to arrival, she was walking on the parking lot, slipped on wet ground and fell backward with her left leg bent backward.  She denies hitting her head or loss of consciousness.  She complained of severe pain throughout her left lower extremity from the knee down.  Pain is described as a burning sensation, severe, worse with movement.  She was able to ambulate with assist.  She denies any associated numbness.  No complaint of back pain or hip pain.  No specific treatment tried.  She denies any precipitating symptoms prior to the fall.  Past Medical History:  Diagnosis Date  . Chronic back pain   . Chronic neck pain   . Hypertension     There are no active problems to display for this patient.   Past Surgical History:  Procedure Laterality Date  . ABDOMINAL SURGERY    . BACK SURGERY     cervical spine    OB History    No data available       Home Medications    Prior to Admission medications   Medication Sig Start Date End Date Taking? Authorizing Provider  LORazepam (ATIVAN) 1 MG tablet Take 1 tablet (1 mg total) by mouth 3 (three) times daily as needed for anxiety (or dizziness). 10/27/14   Dione BoozeGlick, David, MD  meclizine (ANTIVERT) 25 MG tablet Take 1 tablet (25 mg total) by mouth 3 (three) times daily as needed for dizziness. 10/27/14   Dione BoozeGlick, David, MD    Family History No family history on file.  Social History Social History   Tobacco Use  . Smoking status: Never Smoker  . Smokeless tobacco: Never Used  Substance Use Topics  . Alcohol use: No  . Drug use: No      Allergies   Patient has no known allergies.   Review of Systems Review of Systems  All other systems reviewed and are negative.    Physical Exam Updated Vital Signs BP (!) 160/99 (BP Location: Right Arm)   Pulse 72   Temp 98.4 F (36.9 C) (Oral)   Resp 14   SpO2 100%   Physical Exam  Constitutional: She appears well-developed and well-nourished. No distress.  Obese female resting comfortably in bed in no acute discomfort.  HENT:  Head: Atraumatic.  Eyes: Conjunctivae are normal.  Neck: Neck supple.  Musculoskeletal: She exhibits tenderness (Left lower extremity: Tenderness throughout entire leg with gentle palpation however no overlying skin changes, leg compartment is soft, no deformity and no swelling.).  Neurological: She is alert.  Skin: Capillary refill takes less than 2 seconds. No rash noted.  Psychiatric: She has a normal mood and affect.  Nursing note and vitals reviewed.    ED Treatments / Results  Labs (all labs ordered are listed, but only abnormal results are displayed) Labs Reviewed - No data to display  EKG  EKG Interpretation None       Radiology Dg Ankle Complete Left  Result Date: 12/18/2016 CLINICAL DATA:  Pain post fall. EXAM: LEFT ANKLE COMPLETE - 3+ VIEW COMPARISON:  None.  FINDINGS: There is no evidence of fracture, dislocation, or joint effusion. There is no evidence of arthropathy or other focal bone abnormality. Mild lateral soft tissue swelling. IMPRESSION: No acute fracture or dislocation identified about the left ankle. Electronically Signed   By: Ted Mcalpineobrinka  Dimitrova M.D.   On: 12/18/2016 17:30   Dg Knee Complete 4 Views Left  Result Date: 12/18/2016 CLINICAL DATA:  Left knee pain post fall. EXAM: LEFT KNEE - COMPLETE 4+ VIEW COMPARISON:  None. FINDINGS: No evidence of fracture, dislocation, or joint effusion. Well corticated ossific fragments adjacent to the medial tibial spine, may be due to prior trauma or arthritic  changes. The joint spaces are preserved. IMPRESSION: No acute fracture or dislocation identified about the left knee. Electronically Signed   By: Ted Mcalpineobrinka  Dimitrova M.D.   On: 12/18/2016 17:29    Procedures Procedures (including critical care time)  Medications Ordered in ED Medications  acetaminophen (TYLENOL) tablet 650 mg (not administered)     Initial Impression / Assessment and Plan / ED Course  I have reviewed the triage vital signs and the nursing notes.  Pertinent labs & imaging results that were available during my care of the patient were reviewed by me and considered in my medical decision making (see chart for details).     BP (!) 160/99 (BP Location: Right Arm)   Pulse 72   Temp 98.4 F (36.9 C) (Oral)   Resp 14   SpO2 100%    Final Clinical Impressions(s) / ED Diagnoses   Final diagnoses:  Sprain of left ankle, unspecified ligament, initial encounter  Sprain of collateral ligament of left knee, initial encounter    ED Discharge Orders        Ordered    cyclobenzaprine (FLEXERIL) 10 MG tablet  2 times daily PRN     12/18/16 1802    acetaminophen (TYLENOL) 500 MG tablet  Every 6 hours PRN     12/18/16 1802     5:47 PM Patient had a mechanical fall and injured her left lower extremities.  She is complaining of pain from the knee down.  She has decreased range of motion however she does not have any obvious deformity and no evidence to suggest compartment syndrome.  She is neurovascularly intact.  X-ray of her left ankle and left knee without acute fractures or dislocation.  She does have some swelling noted to the lateral aspects of her left ankle.  ASO brace provided.  Likely a sprain.  Crutches provided.  Patient states she is allergic to NSAIDs.  Recommend rice therapy, Tylenol for pain.  Her blood pressure is elevated at 203/105 likely secondary to pain.  She does have history of hypertension.  On recheck BP improves to 160 systolic.  Recommend pt f/u with  PCP for bp recheck.  Ortho referral given as needed.  Return precaution discusse.    Fayrene Helperran, Shariah Assad, PA-C 12/18/16 Julio [id number]    Raeford RazorKohut, Stephen, MD 12/21/16 [phone]

## 2016-12-18 NOTE — Progress Notes (Signed)
Orthopedic Tech Progress Note Patient Details:  Stacey Bray 11/11/1980 [id number]  Ortho Devices Type of Ortho Device: ASO, Crutches Ortho Device/Splint Interventions: Application   Post Interventions Patient Tolerated: Well, Ambulated well Instructions Provided: Poper ambulation with device, Care of device, Adjustment of device   Saul FordyceJennifer C Brycelyn Bray 12/18/2016, 6:15 PM

## 2017-03-14 DIAGNOSIS — F331 Major depressive disorder, recurrent, moderate: Secondary | ICD-10-CM | POA: Insufficient documentation

## 2017-03-19 ENCOUNTER — Other Ambulatory Visit: Payer: Self-pay

## 2017-03-19 ENCOUNTER — Encounter (HOSPITAL_COMMUNITY): Payer: Self-pay | Admitting: Emergency Medicine

## 2017-03-19 ENCOUNTER — Emergency Department (HOSPITAL_COMMUNITY)
Admission: EM | Admit: 2017-03-19 | Discharge: 2017-03-19 | Disposition: A | Discharge status: Home / Self Care | Payer: Managed Care, Other (non HMO) | Attending: Emergency Medicine | Admitting: Emergency Medicine

## 2017-03-19 DIAGNOSIS — T464X5A Adverse effect of angiotensin-converting-enzyme inhibitors, initial encounter: Secondary | ICD-10-CM | POA: Insufficient documentation

## 2017-03-19 DIAGNOSIS — I10 Essential (primary) hypertension: Secondary | ICD-10-CM | POA: Insufficient documentation

## 2017-03-19 DIAGNOSIS — T783XXA Angioneurotic edema, initial encounter: Secondary | ICD-10-CM | POA: Diagnosis not present

## 2017-03-19 DIAGNOSIS — Z79899 Other long term (current) drug therapy: Secondary | ICD-10-CM | POA: Insufficient documentation

## 2017-03-19 LAB — I-STAT CHEM 8, ED
BUN: 8 mg/dL (ref 6–20)
CALCIUM ION: 1.17 mmol/L (ref 1.15–1.40)
CHLORIDE: 99 mmol/L — AB (ref 101–111)
Creatinine, Ser: 0.8 mg/dL (ref 0.44–1.00)
GLUCOSE: 94 mg/dL (ref 65–99)
HCT: 31 % — ABNORMAL LOW (ref 36.0–46.0)
Hemoglobin: 10.5 g/dL — ABNORMAL LOW (ref 12.0–15.0)
Potassium: 3.3 mmol/L — ABNORMAL LOW (ref 3.5–5.1)
Sodium: 140 mmol/L (ref 135–145)
TCO2: 28 mmol/L (ref 22–32)

## 2017-03-19 LAB — I-STAT BETA HCG BLOOD, ED (MC, WL, AP ONLY): I-stat hCG, quantitative: <5 m[IU]/mL (ref <5)

## 2017-03-19 MED ORDER — FAMOTIDINE IN NACL 20-0.9 MG/50ML-% IV SOLN
20.0000 mg | Freq: Once | INTRAVENOUS | Status: AC
Start: 2017-03-19 — End: 2017-03-19
  Administered 2017-03-19: 20 mg via INTRAVENOUS
  Filled 2017-03-19: qty 50

## 2017-03-19 MED ORDER — HYDROCHLOROTHIAZIDE 25 MG PO TABS: 25.0000 mg | ORAL_TABLET | Freq: Every day | ORAL | 0 refills | Status: DC

## 2017-03-19 MED ORDER — EPINEPHRINE 0.3 MG/0.3ML IJ SOAJ
0.3000 mg | Freq: Once | INTRAMUSCULAR | Status: AC
  Administered 2017-03-19: 0.3 mg via INTRAMUSCULAR
  Filled 2017-03-19: qty 0.3

## 2017-03-19 MED ORDER — DIPHENHYDRAMINE HCL 50 MG/ML IJ SOLN
25.0000 mg | Freq: Once | INTRAMUSCULAR | Status: AC
Start: 2017-03-19 — End: 2017-03-19
  Administered 2017-03-19: 25 mg via INTRAVENOUS
  Filled 2017-03-19: qty 1

## 2017-03-19 MED ORDER — METHYLPREDNISOLONE SODIUM SUCC 125 MG IJ SOLR
125.0000 mg | Freq: Once | INTRAMUSCULAR | Status: AC
Start: 2017-03-19 — End: 2017-03-19
  Administered 2017-03-19: 125 mg via INTRAVENOUS
  Filled 2017-03-19: qty 2

## 2017-03-19 NOTE — ED Notes (Signed)
MD at bedside. 

## 2017-03-19 NOTE — ED Notes (Signed)
EDP at bedside  

## 2017-03-19 NOTE — ED Provider Notes (Signed)
[facility]   CSN: [id number] Arrival date & time: 03/19/17  2056     History   Chief Complaint Chief Complaint  Patient presents with  . Angioedema    HPI Stacey Bray is a 37 y.o. female.  The history is provided by the patient.  Allergic Reaction  Presenting symptoms: swelling   Presenting symptoms: no difficulty breathing, no difficulty swallowing, no rash and no wheezing   Severity:  Moderate Duration:  7 hours Prior allergic episodes:  No prior episodes Context: medications (started lisinopril 68mo ago)   Relieved by:  Nothing Worsened by:  Nothing Ineffective treatments:  Antihistamines   Past Medical History:  Diagnosis Date  . Chronic back pain   . Chronic neck pain   . Hypertension     There are no active problems to display for this patient.   Past Surgical History:  Procedure Laterality Date  . ABDOMINAL SURGERY    . BACK SURGERY     cervical spine    OB History    No data available       Home Medications    Prior to Admission medications   Medication Sig Start Date End Date Taking? Authorizing Provider  acetaminophen (TYLENOL) 500 MG tablet Take 1 tablet (500 mg total) by mouth every 6 (six) hours as needed. Patient taking differently: Take 500 mg by mouth every 6 (six) hours as needed for mild pain or headache.  12/18/16  Yes Fayrene Helperran, Bowie, PA-C  amLODipine (NORVASC) 10 MG tablet Take 10 mg by mouth daily. 03/14/17  Yes [provider]  Biotin 10 MG TABS Take 10 mg by mouth 2 (two) times daily.   Yes [provider]  Calcium Carb-Cholecalciferol (CALCIUM + D3 PO) Take 1 tablet by mouth 2 (two) times daily.   Yes [provider]  Cholecalciferol (VITAMIN D-3) 1000 units CAPS Take 2,000 Units by mouth daily.   Yes [provider]  cyclobenzaprine (FLEXERIL) 10 MG tablet Take 1 tablet (10 mg total) by mouth 2 (two) times daily as needed for muscle spasms.  12/18/16  Yes Fayrene Helperran, Bowie, PA-C  diphenhydrAMINE (BENADRYL) 25 MG tablet Take 50 mg by mouth once as needed (for allergic reactions).   Yes [provider]  escitalopram (LEXAPRO) 5 MG tablet Take 5 mg by mouth at bedtime.  03/14/17  Yes [provider]  Multiple Vitamins-Calcium (ONE-A-DAY WOMENS PO) Take 1 tablet by mouth daily.   Yes [provider]  hydrochlorothiazide (HYDRODIURIL) 25 MG tablet Take 1 tablet (25 mg total) by mouth daily. 03/19/17 04/18/17  Dwana Melenaong, Biddie Sebek, DO  LORazepam (ATIVAN) 1 MG tablet Take 1 tablet (1 mg total) by mouth 3 (three) times daily as needed for anxiety (or dizziness). Patient not taking: Reported on 03/19/2017 10/27/14   Dione BoozeGlick, David, MD  meclizine (ANTIVERT) 25 MG tablet Take 1 tablet (25 mg total) by mouth 3 (three) times daily as needed for dizziness. Patient not taking: Reported on 03/19/2017 10/27/14   Dione BoozeGlick, David, MD    Family History History reviewed. No pertinent family history.  Social History Social History   Tobacco Use  . Smoking status: Never Smoker  . Smokeless tobacco: Never Used  Substance Use Topics  . Alcohol use: No  . Drug use: No     Allergies   Latex; Lisinopril; and Levofloxacin   Review of Systems Review of Systems  Constitutional: Negative for chills and fever.  HENT: Positive for facial swelling (upper  lip swelling). Negative for congestion, ear pain, sore throat and trouble swallowing.   Eyes: Negative for pain and visual disturbance.  Respiratory: Negative for cough, shortness of breath and wheezing.   Cardiovascular: Negative for chest pain and palpitations.  Gastrointestinal: Positive for nausea (resolved). Negative for abdominal pain, diarrhea and vomiting.  Genitourinary: Negative for dysuria and hematuria.  Musculoskeletal: Negative for back pain.  Skin: Negative for rash.  Neurological: Negative for light-headedness and headaches.  All other systems reviewed and are  negative.    Physical Exam Updated Vital Signs BP 123/60   Pulse 74   Temp 98.4 F (36.9 C) (Oral)   Resp 18   SpO2 100%   Physical Exam  Constitutional: She appears well-developed and well-nourished. No distress.  HENT:  Head: Normocephalic and atraumatic.    Eyes: Conjunctivae are normal.  Neck: Neck supple.  Cardiovascular: Normal rate and regular rhythm.  No murmur heard. Pulmonary/Chest: Effort normal and breath sounds normal. No respiratory distress.  Abdominal: Soft. There is no tenderness.  Musculoskeletal: She exhibits no edema.  Neurological: She is alert.  Skin: Skin is warm and dry.  Psychiatric: She has a normal mood and affect.  Nursing note and vitals reviewed.    ED Treatments / Results  Labs (all labs ordered are listed, but only abnormal results are displayed) Labs Reviewed  I-STAT CHEM 8, ED - Abnormal; Notable for the following components:      Result Value   Potassium 3.3 (*)    Chloride 99 (*)    Hemoglobin 10.5 (*)    HCT 31.0 (*)    All other components within normal limits  I-STAT BETA HCG BLOOD, ED ([facility], [facility], [facility] ONLY)    EKG  EKG Interpretation None       Radiology No results found.  Procedures Procedures (including critical care time)  Medications Ordered in ED Medications  diphenhydrAMINE (BENADRYL) injection 25 mg (25 mg Intravenous Given 03/19/17 2135)  EPINEPHrine (EPI-PEN) injection 0.3 mg (0.3 mg Intramuscular Given 03/19/17 2134)  methylPREDNISolone sodium succinate (SOLU-MEDROL) 125 mg/2 mL injection 125 mg (125 mg Intravenous Given 03/19/17 2135)  famotidine (PEPCID) IVPB 20 mg premix (0 mg Intravenous Stopped 03/19/17 2205)     Initial Impression / Assessment and Plan / ED Course  I have reviewed the triage vital signs and the nursing notes.  Pertinent labs & imaging results that were available during my care of the patient were reviewed by me and considered in my medical decision making (see chart for  details).     Patient is a 37 year old female with history of hypertension, chronic back and neck pain who presents with angioedema secondary to lisinopril use.  Patient was started on lisinopril 1 month ago.  She presents with 7 hours of worsening upper lip swelling and mild tongue swelling.  She denies any shortness of breath or difficulty swallowing.  She denies any current abdominal pain.  She did have some brief nausea when the symptoms first started, however that has since resolved.  She has not had any diarrhea.    On exam patient is breathing comfortably and has a moderate upper lip swelling.  Tongue appears mildly swollen but does not occlude the airway.  She has no stridor or wheezing.  Patient given IM epinephrine, IV Solu-Medrol, famotidine, Benadryl.  Patient symptoms slowly improving.  Patient monitored for over 2 hours with no worsening of symptoms.  Patient cleared for discharge home with strict return precautions.  Lisinopril is discontinued.  Patient  will be started on hydrochlorothiazide in addition to her current amlodipine for blood pressure control.  Patient to follow-up with her PCP early next week to discuss these changes.  Patient understands and agrees to plan.  Final Clinical Impressions(s) / ED Diagnoses   Final diagnoses:  Angiotensin converting enzyme inhibitor-aggravated angioedema, initial encounter    ED Discharge Orders        Ordered    hydrochlorothiazide (HYDRODIURIL) 25 MG tablet  Daily     03/19/17 2308       Dwana Melena, DO 03/19/17 2328    Gerhard Munch, MD 03/20/17 0001

## 2017-03-19 NOTE — ED Triage Notes (Signed)
Patient presents to ED with angioedema since 1400, with two doses of benadryl without relief.  Patient takes Lisinopril.  Patient just started taking escitolopram on 3/11.

## 2017-05-04 DIAGNOSIS — K641 Second degree hemorrhoids: Secondary | ICD-10-CM | POA: Insufficient documentation

## 2018-12-11 DIAGNOSIS — K912 Postsurgical malabsorption, not elsewhere classified: Secondary | ICD-10-CM | POA: Insufficient documentation

## 2018-12-18 ENCOUNTER — Encounter: Payer: Self-pay | Admitting: Gastroenterology

## 2019-01-22 ENCOUNTER — Other Ambulatory Visit: Payer: Self-pay | Admitting: Emergency Medicine

## 2019-01-23 ENCOUNTER — Other Ambulatory Visit: Payer: Self-pay

## 2019-01-23 ENCOUNTER — Encounter: Payer: Self-pay | Admitting: Gastroenterology

## 2019-01-23 ENCOUNTER — Ambulatory Visit (INDEPENDENT_AMBULATORY_CARE_PROVIDER_SITE_OTHER): Payer: Medicaid Other | Admitting: Gastroenterology

## 2019-01-23 VITALS — BP 154/92 | HR 72 | Temp 98.3°F | Ht 67.0 in | Wt 324.6 lb

## 2019-01-23 DIAGNOSIS — K625 Hemorrhage of anus and rectum: Secondary | ICD-10-CM | POA: Diagnosis not present

## 2019-01-23 DIAGNOSIS — D509 Iron deficiency anemia, unspecified: Secondary | ICD-10-CM | POA: Diagnosis not present

## 2019-01-23 DIAGNOSIS — Z9884 Bariatric surgery status: Secondary | ICD-10-CM

## 2019-01-23 MED ORDER — HYDROCORTISONE (PERIANAL) 2.5 % EX CREA: 1.0000 "application " | TOPICAL_CREAM | Freq: Two times a day (BID) | CUTANEOUS | 1 refills | Status: DC

## 2019-01-23 NOTE — Progress Notes (Signed)
Referring Provider: [facility] Primary Care Physician:  [facility]  Reason for Consultation:  Positive fecal occult blood test   IMPRESSION:  Iron deficiency, microcytic anemia with heme + stools Rectal bleeding with history of hemorrhoids s/p banding  In [place] Colonoscopy in [facility] 2017 Laproscopic gastric sleeve    - complaint with daily supplements including iron BMI 50 Mother with colon polyps  Iron deficiency may be related to the frequent and large volume of rectal bleeding. However, other causes must be considered, particularly given her history of gastric sleeve. However, she is complaint with her supplement including daily iron.  EGD and colonoscopy recommended for definitive diagnosis.   History of rectal bleeding attributed to hemorrhoids. These have been banded in the past. Will obtain prior records from previously colonoscopy and banding for confirmation. I have recommended a repeat colonoscopy as it has been 4 years, but, given the Covid restrictions and the need for this procedure to be performed at the hospital, will try to arrange for band ligation in the short term. Colonoscopy and EGD to evaluate for her anemia and heme + stools as soon as Covid restrictions are lifted for outpatient procedures.     PLAN: - Schedule hemorrhoidal banding - Anusol HC suppositories - Drink at least 64 ounces of water, eat a high fiber diet - Use a daily stool bulking agent such as Metamucil - EGD and colonoscopy at the hospital - Obtain colonoscopy report from 2017 and prior records from band ligation  The nature of the procedure, as well as the risks, benefits, and alternatives were carefully and thoroughly reviewed with the patient. Ample time for discussion and questions allowed. The patient understood, was satisfied, and agreed to proceed.  Please see the "Patient Instructions" section for addition details about the  plan.  HPI: Stacey Bray is a 39 y.o. female referred by NP Hemberg for further evaluation of positive fecal occult blood test. The history is obtained through the patient and review of her electronic health record. Previously evaluated and treated by GI in [place]. Those records are not available to me today. She has morbid obesity,  hypertension, prediabetes, depression/anxiety, sleep apnea and a history of laparoscopic sleeve gastrectomy.  She has intermittent rectal bleeding of a large volume of blood loss that occurs with most bowel movements over the last year. Often has to use a pad. There is an associated pain and rectal pressure.  She attributes this to hemorrhoids that have previously required band ligation. However, the volume of bleeding as increased.    She has a history of alternating diarrhea and constipation.  No rectal pain or fullness.  Stools are dark on iron supplements. No mucous in the stool.   Using Tucks pads, Benefiber daily, and cream. No prior use of suppositories and doesn't want to use them.  No symptoms associated with the anemia.  No fatigue, weakness, headache, irritability, exercise intolerance, exertional dyspnea, vertigo, or angina pectoris.  No pica.  No beeturia.  No hearing loss.  No epistaxis, vaginal bleeding, hemoptysis, or hematuria.   Previously evaluated by [facility] for band ligation. Most recently 2 years ago.  She had a colonoscopy in August 2017 with [facility]. No polyps found at that time.  No prior upper endoscopy.   She takes MVI, iron, vitamin D, and calcium citrate supplements every day. She denies the use NSAIDs.   Labs 10/25/18: normal liver enzymes with ALT 13, TSH 0.647, HgbA1c 5.8, Hgb 10.1,  MCV 71, platelets 237, ferritin 21 Labs 12/08/18: IFOB positive  Mother with colon polyps. No other known family history of colon cancer or polyps. No family history of uterine/endometrial cancer, pancreatic cancer or gastric/stomach  cancer.   Past Medical History:  Diagnosis Date  . Chronic back pain   . Chronic neck pain   . Hypertension     Past Surgical History:  Procedure Laterality Date  . ABDOMINAL SURGERY    . BACK SURGERY     cervical spine    Current Outpatient Medications  Medication Sig Dispense Refill  . acetaminophen (TYLENOL) 500 MG tablet Take 1 tablet (500 mg total) by mouth every 6 (six) hours as needed. (Patient taking differently: Take 500 mg by mouth every 6 (six) hours as needed for mild pain or headache. ) 30 tablet 0  . amLODipine (NORVASC) 10 MG tablet Take 10 mg by mouth daily.    . Biotin 10 MG TABS Take 10 mg by mouth 2 (two) times daily.    . bisoprolol-hydrochlorothiazide (ZIAC) 5-6.25 MG tablet Take 1 tablet by mouth daily.    . busPIRone (BUSPAR) 10 MG tablet Take 1 tablet by mouth 2 (two) times daily.    . Calcium Carb-Cholecalciferol (CALCIUM + D3 PO) Take 1 tablet by mouth 2 (two) times daily.    . Cholecalciferol (VITAMIN D-3) 1000 units CAPS Take 2,000 Units by mouth daily.    . clotrimazole-betamethasone (LOTRISONE) cream Apply to affected area 2 times daily    . cyclobenzaprine (FLEXERIL) 10 MG tablet Take 1 tablet (10 mg total) by mouth 2 (two) times daily as needed for muscle spasms. 20 tablet 0  . diphenhydrAMINE (BENADRYL) 25 MG tablet Take 50 mg by mouth once as needed (for allergic reactions).    Marland Kitchen escitalopram (LEXAPRO) 5 MG tablet Take 5 mg by mouth at bedtime.     . hydrochlorothiazide (HYDRODIURIL) 25 MG tablet Take 1 tablet (25 mg total) by mouth daily. 30 tablet 0  . LORazepam (ATIVAN) 1 MG tablet Take 1 tablet (1 mg total) by mouth 3 (three) times daily as needed for anxiety (or dizziness). (Patient not taking: Reported on 03/19/2017) 15 tablet 0  . meclizine (ANTIVERT) 25 MG tablet Take 1 tablet (25 mg total) by mouth 3 (three) times daily as needed for dizziness. (Patient not taking: Reported on 03/19/2017) 30 tablet 0  . Misc. Devices MISC Start CPAP at 10 cm.  water pressure.  CPAP machine with mask and supplies per patient preference.  ResMed AirFit P30i medium pillow mask for OSA.   Send to [facility] in [place].    . Multiple Vitamins-Calcium (ONE-A-DAY WOMENS PO) Take 1 tablet by mouth daily.     No current facility-administered medications for this visit.    Allergies as of 01/23/2019 - Review Complete 03/19/2017  Allergen Reaction Noted  . Latex Itching and Rash 11/26/2013  . Lisinopril Swelling 03/19/2017  . Levofloxacin Rash 04/29/2014    No family history on file.  Social History   Socioeconomic History  . Marital status: Married    Spouse name: Not on file  . Number of children: Not on file  . Years of education: Not on file  . Highest education level: Not on file  Occupational History  . Not on file  Tobacco Use  . Smoking status: Never Smoker  . Smokeless tobacco: Never Used  Substance and Sexual Activity  . Alcohol use: No  . Drug use: No  . Sexual activity: Yes  Birth control/protection: None  Other Topics Concern  . Not on file  Social History Narrative  . Not on file   Social Determinants of Health   Financial Resource Strain:   . Difficulty of Paying Living Expenses: Not on file  Food Insecurity:   . Worried About Charity fundraiser in the Last Year: Not on file  . Ran Out of Food in the Last Year: Not on file  Transportation Needs:   . Lack of Transportation (Medical): Not on file  . Lack of Transportation (Non-Medical): Not on file  Physical Activity:   . Days of Exercise per Week: Not on file  . Minutes of Exercise per Session: Not on file  Stress:   . Feeling of Stress : Not on file  Social Connections:   . Frequency of Communication with Friends and Family: Not on file  . Frequency of Social Gatherings with Friends and Family: Not on file  . Attends Religious Services: Not on file  . Active Member of Clubs or Organizations: Not on file  . Attends Archivist Meetings: Not on file  .  Marital Status: Not on file  Intimate Partner Violence:   . Fear of Current or Ex-Partner: Not on file  . Emotionally Abused: Not on file  . Physically Abused: Not on file  . Sexually Abused: Not on file    Review of Systems: 12 system ROS is negative except as noted above.   Physical Exam: General:   Alert,  well-nourished, pleasant and cooperative in NAD Head:  Normocephalic and atraumatic. Eyes:  Sclera clear, no icterus.   Conjunctiva pink. Abdomen:  Soft, nontender, nondistended, normal bowel sounds, no rebound or guarding. No hepatosplenomegaly.   Rectal:   No chemical dermatitis. No external hemorrhoids, fissure or fistula. No prolapsing hemorrhoids. Blood in the vault. Palpable, non-tender internal hemorrhoids. No rectal prolapse. Normal anocutaneous reflex. No stool in the rectal vault. No mass or fecal impaction. Normal anal resting tone. Chaperone: Desiree Msk:  Symmetrical. No boney deformities LAD: No inguinal or umbilical LAD Extremities:  No clubbing or edema. Neurologic:  Alert and  oriented x4;  grossly nonfocal Skin:  No obvious rash or bruise Psych:  Alert and cooperative. Normal mood and affect.     Aidden Markovic L. Tarri Glenn, MD, MPH 01/23/2019, 8:33 AM

## 2019-01-23 NOTE — Patient Instructions (Addendum)
-   Mygihealth.com and UpToDate.com have good information about hemorrhoids. - Use the Anusol HC and apply it to a glycerin suppository when you are having problems. Please do not use them every day for more than two weeks. Please call my if your symptoms are not responding to the Anusol.  - Continue to use Benefiber every day to insure soft, formed stools.  - I have recommended hemorrhoid banding with one of my partners. - Continue to take your iron supplements. Your body may absorb more iron if you use it every other day.  - When Covid restrictions are lifted, you should have a colonoscopy and upper endoscopy to further evaluate your anemia.

## 2019-02-05 ENCOUNTER — Encounter: Payer: Self-pay | Admitting: Gastroenterology

## 2019-02-05 ENCOUNTER — Ambulatory Visit (INDEPENDENT_AMBULATORY_CARE_PROVIDER_SITE_OTHER): Payer: Medicaid Other | Admitting: Gastroenterology

## 2019-02-05 ENCOUNTER — Other Ambulatory Visit: Payer: Self-pay

## 2019-02-05 VITALS — BP 130/70 | HR 73 | Temp 97.0°F | Ht 67.0 in | Wt 322.0 lb

## 2019-02-05 DIAGNOSIS — K641 Second degree hemorrhoids: Secondary | ICD-10-CM | POA: Diagnosis not present

## 2019-02-05 DIAGNOSIS — K602 Anal fissure, unspecified: Secondary | ICD-10-CM

## 2019-02-05 MED ORDER — AMBULATORY NON FORMULARY MEDICATION: 1 refills | Status: DC

## 2019-02-05 NOTE — Patient Instructions (Addendum)
We have sent a prescription for nitroglycerin 0.125% gel to [facility]. You should apply a pea size amount to your rectum three times daily x 6-8 weeks.  [facility]'s information is below: Address: [address] [place], [place] [address]  [phone]  *Please DO NOT go directly from our office to pick up this medication! Give the pharmacy 1 day to process the prescription as this is compounded and takes time to make.   Anal Fissure, Adult  An anal fissure is a small tear or crack in the tissue around the opening of the butt (anus). Bleeding from the tear or crack usually stops on its own within a few minutes. The bleeding may happen every time you poop (have a bowel movement) until the tear or crack heals. What are the causes? This condition is usually caused by passing a large or hard poop (stool). Other causes include:  Trouble pooping (constipation).  Passing watery poop (diarrhea).  Inflammatory bowel disease (Crohn's disease or ulcerative colitis).  Childbirth.  Infections.  Anal sex. What are the signs or symptoms? Symptoms of this condition include:  Bleeding from the butt.  Small amounts of blood on your poop. The blood coats the outside of the poop. It is not mixed with the poop.  Small amounts of blood on the toilet paper or in the toilet after you poop.  Pain when passing poop.  Itching or irritation around the opening of the butt. How is this diagnosed? This condition may be diagnosed based on a physical exam. Your doctor may:  Check your butt. A tear can often be seen by checking the area with care.  Check your butt using a short tube (anoscope). The light in the tube will show any problems in your butt. How is this treated? Treatment for this condition may include:  Treating problems that make it hard for you to pass poop. You may be told to: ? Eat more fiber. ? Drink more fluid. ? Take fiber supplements. ? Take medicines  that make poop soft.  Taking sitz baths. This may help to heal the tear.  Using creams and ointments. If your condition gets worse, other treatments may be needed such as:  A shot near the tear or crack (botulinum injection).  Surgery to repair the tear or crack. Follow these instructions at home: Eating and drinking   Avoid bananas and dairy products. These foods can make it hard to poop.  Drink enough fluid to keep your pee (urine) pale yellow.  Eat foods that have a lot of fiber in them, such as: ? Beans. ? Whole grains. ? Fresh fruits. ? Fresh vegetables. General instructions   Take over-the-counter and prescription medicines only as told by your doctor.  Use creams or ointments only as told by your doctor.  Keep the butt area as clean and dry as you can.  Take a warm water bath (sitz bath) as told by your doctor. Do not use soap.  Keep all follow-up visits as told by your doctor. This is important. Contact a doctor if:  You have more bleeding.  You have a fever.  You have watery poop that is mixed with blood.  You have pain.  Your problem gets worse, not better. Summary  An anal fissure is a small tear or crack in the skin around the opening of the butt (anus).  This condition is usually caused by passing a large or hard poop (stool).  Treatment includes treating the  problems that make it hard for you to pass poop.  Follow your doctor's instructions about caring for your condition at home.  Keep all follow-up visits as told by your doctor. This is important. This information is not intended to replace advice given to you by your health care provider. Make sure you discuss any questions you have with your health care provider. Document Revised: 06/02/2017 Document Reviewed: 06/02/2017 Elsevier Patient Education  2020 Elsevier Inc.  Continue Metamucil  I appreciate the  opportunity to care for you  Thank You   Marsa Aris , MD

## 2019-02-05 NOTE — Progress Notes (Signed)
Stacey Bray    [id number]    Dec 24, 1980  Primary Care Physician:[facility]  Referring Physician: [facility] [address] [place],  [place] [address]   Chief complaint: Rectal bleeding HPI:  39 year old female with morbid obesity, iron deficiency anemia with rectal bleeding and heme positive stool here for hemorrhoidal band ligation for symptomatic internal hemorrhoids. She has bright red blood per rectum almost daily with every bowel movement and also complains of rectal discomfort.  She was having hard stool with oral iron supplements, somewhat improved since she started stool softener.  She is also taking Metamucil 1 tablespoon daily. Dr. Orvan Falconer is planning to schedule her for EGD and colonoscopy for further evaluation of iron deficiency anemia and rectal bleeding.  Colonoscopy in [facility] 2017, no polyps.  Laproscopic gastric sleeve 2016  Outpatient Encounter Medications as of 02/05/2019  Medication Sig  . acetaminophen (TYLENOL) 500 MG tablet Take 1 tablet (500 mg total) by mouth every 6 (six) hours as needed. (Patient taking differently: Take 500 mg by mouth every 6 (six) hours as needed for mild pain or headache. )  . amLODipine (NORVASC) 10 MG tablet Take 10 mg by mouth daily.  . Biotin 10 MG TABS Take 10 mg by mouth 2 (two) times daily.  . bisoprolol-hydrochlorothiazide (ZIAC) 5-6.25 MG tablet Take 1 tablet by mouth daily.  . busPIRone (BUSPAR) 10 MG tablet Take 1 tablet by mouth 2 (two) times daily.  . Calcium Carb-Cholecalciferol (CALCIUM + D3 PO) Take 1 tablet by mouth 2 (two) times daily.  . Cholecalciferol (VITAMIN D-3) 1000 units CAPS Take 2,000 Units by mouth daily.  . clotrimazole-betamethasone (LOTRISONE) cream Apply to affected area 2 times daily  . cyclobenzaprine (FLEXERIL) 10 MG tablet Take 1 tablet (10 mg total) by mouth 2 (two) times daily as needed for muscle  spasms.  . diphenhydrAMINE (BENADRYL) 25 MG tablet Take 50 mg by mouth once as needed (for allergic reactions).  . docusate sodium (COLACE) 250 MG capsule Take 250 mg by mouth daily.  Marland Kitchen escitalopram (LEXAPRO) 5 MG tablet Take 5 mg by mouth at bedtime.   . ferrous sulfate 325 (65 FE) MG tablet Take 325 mg by mouth 2 (two) times daily with a meal.  . hydrocortisone (ANUSOL-HC) 2.5 % rectal cream Place 1 application rectally 2 (two) times daily.  Marland Kitchen LORazepam (ATIVAN) 1 MG tablet Take 1 tablet (1 mg total) by mouth 3 (three) times daily as needed for anxiety (or dizziness).  . meclizine (ANTIVERT) 25 MG tablet Take 1 tablet (25 mg total) by mouth 3 (three) times daily as needed for dizziness.  . Misc. Devices MISC Start CPAP at 10 cm. water pressure.  CPAP machine with mask and supplies per patient preference.  ResMed AirFit P30i medium pillow mask for OSA.   Send to [facility] in [place].  . Multiple Vitamins-Calcium (ONE-A-DAY WOMENS PO) Take 1 tablet by mouth daily.  . AMBULATORY NON FORMULARY MEDICATION Medication Name: Nitroglycerin ointment 0.125% Use pea sized amount three times a day for 6-8 weeks  . hydrochlorothiazide (HYDRODIURIL) 25 MG tablet Take 1 tablet (25 mg total) by mouth daily.   No facility-administered encounter medications on file as of 02/05/2019.    Allergies as of 02/05/2019 - Review Complete 02/05/2019  Allergen Reaction Noted  . Latex Itching and Rash 11/26/2013  . Lisinopril Swelling 03/19/2017  . Levofloxacin Rash 04/29/2014  Past Medical History:  Diagnosis Date  . Anemia   . Chronic back pain   . Chronic neck pain   . Hypertension   . Sleep apnea     Past Surgical History:  Procedure Laterality Date  . ABDOMINAL SURGERY    . BACK SURGERY     cervical spine  . LAPAROSCOPIC GASTRIC SLEEVE RESECTION  2016    Family History  Problem Relation Age of Onset  . Colon cancer Neg Hx   . Esophageal cancer Neg Hx   . Pancreatic cancer Neg Hx   . Stomach  cancer Neg Hx   . Liver disease Neg Hx     Social History   Socioeconomic History  . Marital status: Married    Spouse name: Not on file  . Number of children: Not on file  . Years of education: Not on file  . Highest education level: Not on file  Occupational History  . Not on file  Tobacco Use  . Smoking status: Never Smoker  . Smokeless tobacco: Never Used  Substance and Sexual Activity  . Alcohol use: No  . Drug use: No  . Sexual activity: Yes    Birth control/protection: None  Other Topics Concern  . Not on file  Social History Narrative  . Not on file   Social Determinants of Health   Financial Resource Strain:   . Difficulty of Paying Living Expenses: Not on file  Food Insecurity:   . Worried About Programme researcher, broadcasting/film/video in the Last Year: Not on file  . Ran Out of Food in the Last Year: Not on file  Transportation Needs:   . Lack of Transportation (Medical): Not on file  . Lack of Transportation (Non-Medical): Not on file  Physical Activity:   . Days of Exercise per Week: Not on file  . Minutes of Exercise per Session: Not on file  Stress:   . Feeling of Stress : Not on file  Social Connections:   . Frequency of Communication with Friends and Family: Not on file  . Frequency of Social Gatherings with Friends and Family: Not on file  . Attends Religious Services: Not on file  . Active Member of Clubs or Organizations: Not on file  . Attends Banker Meetings: Not on file  . Marital Status: Not on file  Intimate Partner Violence:   . Fear of Current or Ex-Partner: Not on file  . Emotionally Abused: Not on file  . Physically Abused: Not on file  . Sexually Abused: Not on file      Review of systems:  All other review of systems negative except as mentioned in the HPI.   Physical Exam: Vitals:   02/05/19 1532  BP: 130/70  Pulse: 73  Temp: (!) 97 F (36.1 C)   Body mass index is 50.43 kg/m.  Data Reviewed:  Reviewed labs,  radiology imaging, old records and pertinent past GI work up   Assessment and Plan/Recommendations: Procedure Note: The patient presents with symptomatic grade II  hemorrhoids, requesting rubber band ligation of his/her hemorrhoidal disease.  All risks, benefits and alternative forms of therapy were described and informed consent was obtained.  The anorectum was pre-medicated with 0.125% Nitroglycerine  In the Left Lateral Decubitus position anoscopic examination revealed grade II hemorrhoids in the Right anterior, grade 1 in right posterior and left lateral position(s).   Anterior anal fissure++ with anal tenderness  The decision was made to not proceed with hemorrhoidal band ligation  due to anal fissure  Instructed patient to apply small pea-sized amount 0.125% nitroglycerin per rectum 3 times daily for 6 to 8 weeks Continue Metamucil 1 teaspoon 2-3 times daily with meals The patient was discharged home without pain or other issues.  Dietary and behavioral recommendations were given and along with follow-up instructions.    Follow-up with Dr. Tarri Glenn in 6-8 weeks, if continues to have rectal bleeding after healing of anal fissure, can consider hemorrhoidal band ligation at that point.  No complications were encountered and the patient tolerated the procedure well.  Damaris Hippo , MD [phone]   CC: [facility]

## 2019-03-06 ENCOUNTER — Other Ambulatory Visit: Payer: Self-pay | Admitting: Emergency Medicine

## 2019-03-06 DIAGNOSIS — D509 Iron deficiency anemia, unspecified: Secondary | ICD-10-CM

## 2019-03-06 DIAGNOSIS — K625 Hemorrhage of anus and rectum: Secondary | ICD-10-CM

## 2019-03-06 DIAGNOSIS — Z9884 Bariatric surgery status: Secondary | ICD-10-CM

## 2019-03-06 MED ORDER — NA SULFATE-K SULFATE-MG SULF 17.5-3.13-1.6 GM/177ML PO SOLN: 1.0000 | ORAL | 0 refills | Status: DC

## 2019-03-09 ENCOUNTER — Other Ambulatory Visit (HOSPITAL_COMMUNITY)
Admission: RE | Admit: 2019-03-09 | Discharge: 2019-03-09 | Disposition: A | Discharge status: Home / Self Care | Payer: Medicaid Other | Source: Ambulatory Visit | Attending: Internal Medicine | Admitting: Internal Medicine

## 2019-03-09 DIAGNOSIS — Z01812 Encounter for preprocedural laboratory examination: Secondary | ICD-10-CM | POA: Diagnosis present

## 2019-03-09 DIAGNOSIS — Z20822 Contact with and (suspected) exposure to covid-19: Secondary | ICD-10-CM | POA: Diagnosis not present

## 2019-03-09 LAB — SARS CORONAVIRUS 2 (TAT 6-24 HRS): SARS Coronavirus 2: NEGATIVE

## 2019-03-13 ENCOUNTER — Encounter (HOSPITAL_COMMUNITY): Payer: Self-pay | Admitting: Gastroenterology

## 2019-03-13 ENCOUNTER — Telehealth: Payer: Self-pay

## 2019-03-13 ENCOUNTER — Ambulatory Visit (HOSPITAL_COMMUNITY)
Admission: RE | Admit: 2019-03-13 | Discharge: 2019-03-13 | Disposition: A | Discharge status: Home / Self Care | Payer: Medicaid Other | Source: Ambulatory Visit | Attending: Gastroenterology | Admitting: Gastroenterology

## 2019-03-13 ENCOUNTER — Ambulatory Visit (HOSPITAL_COMMUNITY): Payer: Medicaid Other | Admitting: Anesthesiology

## 2019-03-13 ENCOUNTER — Other Ambulatory Visit: Payer: Self-pay

## 2019-03-13 ENCOUNTER — Encounter (HOSPITAL_COMMUNITY)
Admission: RE | Disposition: A | Discharge status: Home / Self Care | Payer: Self-pay | Source: Ambulatory Visit | Attending: Gastroenterology

## 2019-03-13 DIAGNOSIS — Z9884 Bariatric surgery status: Secondary | ICD-10-CM | POA: Diagnosis not present

## 2019-03-13 DIAGNOSIS — K625 Hemorrhage of anus and rectum: Secondary | ICD-10-CM | POA: Diagnosis not present

## 2019-03-13 DIAGNOSIS — K644 Residual hemorrhoidal skin tags: Secondary | ICD-10-CM | POA: Insufficient documentation

## 2019-03-13 DIAGNOSIS — K648 Other hemorrhoids: Secondary | ICD-10-CM | POA: Insufficient documentation

## 2019-03-13 DIAGNOSIS — K635 Polyp of colon: Secondary | ICD-10-CM

## 2019-03-13 DIAGNOSIS — I10 Essential (primary) hypertension: Secondary | ICD-10-CM | POA: Insufficient documentation

## 2019-03-13 DIAGNOSIS — G4730 Sleep apnea, unspecified: Secondary | ICD-10-CM | POA: Insufficient documentation

## 2019-03-13 DIAGNOSIS — Z8719 Personal history of other diseases of the digestive system: Secondary | ICD-10-CM | POA: Insufficient documentation

## 2019-03-13 DIAGNOSIS — D509 Iron deficiency anemia, unspecified: Secondary | ICD-10-CM

## 2019-03-13 DIAGNOSIS — Z8371 Family history of colonic polyps: Secondary | ICD-10-CM | POA: Diagnosis not present

## 2019-03-13 DIAGNOSIS — Z6841 Body Mass Index (BMI) 40.0 and over, adult: Secondary | ICD-10-CM | POA: Diagnosis not present

## 2019-03-13 DIAGNOSIS — R195 Other fecal abnormalities: Secondary | ICD-10-CM | POA: Diagnosis present

## 2019-03-13 DIAGNOSIS — R7303 Prediabetes: Secondary | ICD-10-CM | POA: Insufficient documentation

## 2019-03-13 HISTORY — PX: COLONOSCOPY WITH PROPOFOL: SHX5780

## 2019-03-13 HISTORY — PX: ESOPHAGOGASTRODUODENOSCOPY (EGD) WITH PROPOFOL: SHX5813

## 2019-03-13 HISTORY — PX: POLYPECTOMY: SHX5525

## 2019-03-13 LAB — PREGNANCY, URINE: Preg Test, Ur: NEGATIVE

## 2019-03-13 SURGERY — COLONOSCOPY WITH PROPOFOL
Anesthesia: Monitor Anesthesia Care

## 2019-03-13 MED ORDER — PROPOFOL 10 MG/ML IV BOLUS
INTRAVENOUS | Status: AC
  Filled 2019-03-13: qty 20

## 2019-03-13 MED ORDER — PROPOFOL 500 MG/50ML IV EMUL
INTRAVENOUS | Status: DC | PRN
  Administered 2019-03-13: 30 mg via INTRAVENOUS
  Administered 2019-03-13: 70 mg via INTRAVENOUS
  Administered 2019-03-13: 75 ug/kg/min via INTRAVENOUS

## 2019-03-13 MED ORDER — GLYCOPYRROLATE 0.2 MG/ML IJ SOLN
INTRAMUSCULAR | Status: DC | PRN
  Administered 2019-03-13: .2 mg via INTRAVENOUS

## 2019-03-13 MED ORDER — ONDANSETRON HCL 4 MG/2ML IJ SOLN
INTRAMUSCULAR | Status: DC | PRN
  Administered 2019-03-13: 4 mg via INTRAVENOUS

## 2019-03-13 MED ORDER — LACTATED RINGERS IV SOLN
INTRAVENOUS | Status: DC
  Administered 2019-03-13: 09:00:00 1000 mL via INTRAVENOUS

## 2019-03-13 MED ORDER — SODIUM CHLORIDE 0.9 % IV SOLN: INTRAVENOUS | Status: DC

## 2019-03-13 MED ORDER — MIDAZOLAM HCL 5 MG/5ML IJ SOLN
INTRAMUSCULAR | Status: DC | PRN
  Administered 2019-03-13: 2 mg via INTRAVENOUS

## 2019-03-13 MED ORDER — LIDOCAINE HCL (CARDIAC) PF 100 MG/5ML IV SOSY
PREFILLED_SYRINGE | INTRAVENOUS | Status: DC | PRN
  Administered 2019-03-13: 100 mg via INTRAVENOUS

## 2019-03-13 MED ORDER — MIDAZOLAM HCL 2 MG/2ML IJ SOLN
INTRAMUSCULAR | Status: AC
  Filled 2019-03-13: qty 2

## 2019-03-13 SURGICAL SUPPLY — 24 items

## 2019-03-13 NOTE — Anesthesia Postprocedure Evaluation (Signed)
Anesthesia Post Note  Patient: Stacey Bray  Procedure(s) Performed: COLONOSCOPY WITH PROPOFOL (N/A ) ESOPHAGOGASTRODUODENOSCOPY (EGD) WITH PROPOFOL (N/A )     Patient location during evaluation: PACU Anesthesia Type: MAC Level of consciousness: awake and alert Pain management: pain level controlled Vital Signs Assessment: post-procedure vital signs reviewed and stable Respiratory status: spontaneous breathing, nonlabored ventilation and respiratory function stable Cardiovascular status: blood pressure returned to baseline and stable Postop Assessment: no apparent nausea or vomiting Anesthetic complications: no    Last Vitals:  Vitals:   03/13/19 1030 03/13/19 1040  BP: (!) 151/108   Pulse: 63 (!) 57  Resp: (!) 25 20  Temp:    SpO2: 99% 100%    Last Pain:  Vitals:   03/13/19 1030  TempSrc:   PainSc: 0-No pain                 Pervis Hocking

## 2019-03-13 NOTE — Op Note (Signed)
[facility] Patient Name: Stacey Bray Procedure Date: 03/13/2019 MRN: [id number] Attending MD: Tressia Danas MD, MD Date of Birth: [birth year] CSN: [id number] Age: 39 Admit Type: Outpatient Procedure:                Colonoscopy Indications:              Unexplained iron deficiency anemia                           Iron deficiency, microcytic anemia with heme +                            stools                           Rectal bleeding with history of hemorrhoids s/p                            banding In [facility]                           Colonoscopy in [place] 2017                           Mother with colon polyps Providers:                Tressia Danas MD, MD, Dwain Sarna, RN, Lawson Radar, Technician, Greig Right, CRNA Referring MD:              Medicines:                Monitored Anesthesia Care Complications:            No immediate complications. Estimated blood loss:                            Minimal. Estimated Blood Loss:     Estimated blood loss was minimal. Procedure:                Pre-Anesthesia Assessment:                           - Prior to the procedure, a History and Physical                            was performed, and patient medications and                            allergies were reviewed. The patient's tolerance of                            previous anesthesia was also reviewed. The risks                            and benefits of the procedure and the sedation  options and risks were discussed with the patient.                            All questions were answered, and informed consent                            was obtained. Prior Anticoagulants: The patient has                            taken no previous anticoagulant or antiplatelet                            agents. ASA Grade Assessment: III - A patient with                            severe systemic disease. After  reviewing the risks                            and benefits, the patient was deemed in                            satisfactory condition to undergo the procedure.                           After obtaining informed consent, the colonoscope                            was passed under direct vision. Throughout the                            procedure, the patient's blood pressure, pulse, and                            oxygen saturations were monitored continuously. The                            CF-HQ190L ([id number] Olympus colonoscope was                            introduced through the anus and advanced to the 4                            cm into the ileum. A second forward view of the                            right colon was performed. The colonoscopy was                            performed without difficulty. The patient tolerated                            the procedure well. The quality of the bowel  preparation was excellent. The terminal ileum,                            ileocecal valve, appendiceal orifice, and rectum                            were photographed. Scope In: 10:00:42 AM Scope Out: 10:12:08 AM Scope Withdrawal Time: 0 hours 9 minutes 20 seconds  Total Procedure Duration: 0 hours 11 minutes 26 seconds  Findings:      A 1 mm polyp was found in the sigmoid colon. The polyp was sessile. The       polyp was removed with a cold snare. Resection and retrieval were       complete. Estimated blood loss: none.      Non-bleeding external and prolapsing internal hemorrhoids were found.       The hemorrhoids were medium-sized. The previously identified anterior       anal fissure has healed.      The exam was otherwise without abnormality on direct and retroflexion       views. Impression:               - One 1 mm polyp in the sigmoid colon, removed with                            a cold snare. Resected and retrieved.                           -  Non-bleeding external and internal hemorrhoids.                           - The examination was otherwise normal on direct                            and retroflexion views. Moderate Sedation:      Not Applicable - Patient had care per Anesthesia. Recommendation:           - Patient has a contact number available for                            emergencies. The signs and symptoms of potential                            delayed complications were discussed with the                            patient. Return to normal activities tomorrow.                            Written discharge instructions were provided to the                            patient.                           - Resume previous diet.                           -  Continue present medications.                           - Await pathology results.                           - Repeat colonoscopy date to be determined after                            pending pathology results are reviewed for                            surveillance.                           - Return to GI office to follow-up with Dr. Orvan Falconer.                           - Consider surgical referral re: prolapsed                            hemorrhoids. Procedure Code(s):        --- Professional ---                           [phone], Colonoscopy, flexible; with removal of                            tumor(s), polyp(s), or other lesion(s) by snare                            technique Diagnosis Code(s):        --- Professional ---                           K63.5, Polyp of colon                           K64.8, Other hemorrhoids                           D50.9, Iron deficiency anemia, unspecified CPT copyright 2019 American Medical Association. All rights reserved. The codes documented in this report are preliminary and upon coder review may  be revised to meet current compliance requirements. Tressia Danas MD, MD 03/13/2019 10:36:34 AM This report has been signed  electronically. Number of Addenda: 0

## 2019-03-13 NOTE — H&P (Signed)
Referring Provider: No ref. provider found Primary Care Physician:  [facility]  Reason for Consultation:  Positive fecal occult blood test   IMPRESSION:  Iron deficiency, microcytic anemia with heme + stools Rectal bleeding with history of hemorrhoids s/p banding  In [place] Colonoscopy in [place] 2017 Laproscopic gastric sleeve    - complaint with daily supplements including iron BMI 49 Mother with colon polyps  Iron deficiency may be related to the frequent and large volume of rectal bleeding. However, other causes must be considered, particularly given her history of gastric sleeve. However, she is complaint with her supplement including daily iron.  EGD and colonoscopy recommended for definitive diagnosis.   History of rectal bleeding attributed to hemorrhoids. These have been banded in the past. Will obtain prior records from previously colonoscopy and banding for confirmation. I have recommended a repeat colonoscopy as it has been 4 years, but, given the Covid restrictions and the need for this procedure to be performed at the hospital, will try to arrange for band ligation in the short term. Colonoscopy and EGD to evaluate for her anemia and heme + stools as soon as Covid restrictions are lifted for outpatient procedures.     PLAN: - EGD and colonoscopy at the hospital today   The nature of the procedure, as well as the risks, benefits, and alternatives were carefully and thoroughly reviewed with the patient. Ample time for discussion and questions allowed. The patient understood, was satisfied, and agreed to proceed.  Please see the "Patient Instructions" section for addition details about the plan.  HPI: Stacey Bray is a 39 y.o. female referred by NP Hemberg for further evaluation of positive fecal occult blood test. The history is obtained through the patient and review of her electronic health record. Previously evaluated and  treated by GI in [place]. Those records are not available to me today. She has morbid obesity,  hypertension, prediabetes, depression/anxiety, sleep apnea and a history of laparoscopic sleeve gastrectomy.  She has intermittent rectal bleeding of a large volume of blood loss that occurs with most bowel movements over the last year. Often has to use a pad. There is an associated pain and rectal pressure.  She attributes this to hemorrhoids that have previously required band ligation. However, the volume of bleeding as increased.    She has a history of alternating diarrhea and constipation.  No rectal pain or fullness.  Stools are dark on iron supplements. No mucous in the stool.   Using Tucks pads, Benefiber daily, and cream. No prior use of suppositories and doesn't want to use them.  No symptoms associated with the anemia.  No fatigue, weakness, headache, irritability, exercise intolerance, exertional dyspnea, vertigo, or angina pectoris.  No pica.  No beeturia.  No hearing loss.  No epistaxis, vaginal bleeding, hemoptysis, or hematuria.   Previously evaluated by [facility] for band ligation. Most recently 2 years ago.  She had a colonoscopy in August 2017 with [facility]. No polyps found at that time.  No prior upper endoscopy.   She takes MVI, iron, vitamin D, and calcium citrate supplements every day. She denies the use NSAIDs.   Labs 10/25/18: normal liver enzymes with ALT 13, TSH 0.647, HgbA1c 5.8, Hgb 10.1, MCV 71, platelets 237, ferritin 21 Labs 12/08/18: IFOB positive  Mother with colon polyps. No other known family history of colon cancer or polyps. No family history of uterine/endometrial cancer, pancreatic cancer or gastric/stomach cancer.   Past Medical  History:  Diagnosis Date  . Anemia   . Chronic back pain   . Chronic neck pain   . Hypertension   . Sleep apnea     Past Surgical History:  Procedure Laterality Date  . ABDOMINAL SURGERY    . BACK SURGERY       cervical spine  . LAPAROSCOPIC GASTRIC SLEEVE RESECTION  2016    No current facility-administered medications for this encounter.    Allergies as of 03/06/2019 - Review Complete 02/05/2019  Allergen Reaction Noted  . Latex Itching and Rash 11/26/2013  . Lisinopril Swelling 03/19/2017  . Levofloxacin Rash 04/29/2014    Family History  Problem Relation Age of Onset  . Colon cancer Neg Hx   . Esophageal cancer Neg Hx   . Pancreatic cancer Neg Hx   . Stomach cancer Neg Hx   . Liver disease Neg Hx     Social History   Socioeconomic History  . Marital status: Married    Spouse name: Not on file  . Number of children: Not on file  . Years of education: Not on file  . Highest education level: Not on file  Occupational History  . Not on file  Tobacco Use  . Smoking status: Never Smoker  . Smokeless tobacco: Never Used  Substance and Sexual Activity  . Alcohol use: No  . Drug use: No  . Sexual activity: Yes    Birth control/protection: None  Other Topics Concern  . Not on file  Social History Narrative  . Not on file   Social Determinants of Health   Financial Resource Strain:   . Difficulty of Paying Living Expenses: Not on file  Food Insecurity:   . Worried About Programme researcher, broadcasting/film/video in the Last Year: Not on file  . Ran Out of Food in the Last Year: Not on file  Transportation Needs:   . Lack of Transportation (Medical): Not on file  . Lack of Transportation (Non-Medical): Not on file  Physical Activity:   . Days of Exercise per Week: Not on file  . Minutes of Exercise per Session: Not on file  Stress:   . Feeling of Stress : Not on file  Social Connections:   . Frequency of Communication with Friends and Family: Not on file  . Frequency of Social Gatherings with Friends and Family: Not on file  . Attends Religious Services: Not on file  . Active Member of Clubs or Organizations: Not on file  . Attends Banker Meetings: Not on file  . Marital  Status: Not on file  Intimate Partner Violence:   . Fear of Current or Ex-Partner: Not on file  . Emotionally Abused: Not on file  . Physically Abused: Not on file  . Sexually Abused: Not on file    Review of Systems: 12 system ROS is negative except as noted above.   Physical Exam: General:   Alert,  well-nourished, pleasant and cooperative in NAD Head:  Normocephalic and atraumatic. Eyes:  Sclera clear, no icterus.   Conjunctiva pink. Abdomen:  Soft, nontender, nondistended, normal bowel sounds, no rebound or guarding. No hepatosplenomegaly.   Rectal:   No chemical dermatitis. No external hemorrhoids, fissure or fistula. No prolapsing hemorrhoids. Blood in the vault. Palpable, non-tender internal hemorrhoids. No rectal prolapse. Normal anocutaneous reflex. No stool in the rectal vault. No mass or fecal impaction. Normal anal resting tone. Chaperone: Desiree Msk:  Symmetrical. No boney deformities LAD: No inguinal or umbilical LAD Extremities:  No clubbing or edema. Neurologic:  Alert and  oriented x4;  grossly nonfocal Skin:  No obvious rash or bruise Psych:  Alert and cooperative. Normal mood and affect.     Madolyn Ackroyd L. Orvan Falconer, MD, MPH 03/13/2019, 8:13 AM

## 2019-03-13 NOTE — Transfer of Care (Signed)
Immediate Anesthesia Transfer of Care Note  Patient: Stacey Bray  Procedure(s) Performed: Procedure(s): COLONOSCOPY WITH PROPOFOL (N/A) ESOPHAGOGASTRODUODENOSCOPY (EGD) WITH PROPOFOL (N/A)  Patient Location: PACU  Anesthesia Type:MAC  Level of Consciousness:  sedated, patient cooperative and responds to stimulation  Airway & Oxygen Therapy:Patient Spontanous Breathing and Patient connected to face mask oxgen  Post-op Assessment:  Report given to PACU RN and Post -op Vital signs reviewed and stable  Post vital signs:  Reviewed and stable  Last Vitals:  Vitals:   03/13/19 0903  BP: (!) 151/75  Pulse: 68  Resp: 20  Temp: 36.8 C  SpO2: 74%    Complications: No apparent anesthesia complications

## 2019-03-13 NOTE — Op Note (Signed)
[facility] Patient Name: Stacey Bray Procedure Date: 03/13/2019 MRN: [id number] Attending MD: Tressia Danas MD, MD Date of Birth: [birth year] CSN: [id number] Age: 39 Admit Type: Outpatient Procedure:                Upper GI endoscopy Indications:              Iron deficiency, microcytic anemia with heme +                            stools                           Laproscopic gastric sleeve                           - complaint with daily supplements including iron Providers:                Tressia Danas MD, MD, Dwain Sarna, RN, Lawson Radar, Technician, Greig Right, CRNA Referring MD:              Medicines:                Monitored Anesthesia Care Complications:            No immediate complications. Estimated Blood Loss:     Estimated blood loss: none. Procedure:                Pre-Anesthesia Assessment:                           - Prior to the procedure, a History and Physical                            was performed, and patient medications and                            allergies were reviewed. The patient's tolerance of                            previous anesthesia was also reviewed. The risks                            and benefits of the procedure and the sedation                            options and risks were discussed with the patient.                            All questions were answered, and informed consent                            was obtained. Prior Anticoagulants: The patient has                            taken no previous anticoagulant or  antiplatelet                            agents. ASA Grade Assessment: III - A patient with                            severe systemic disease. After reviewing the risks                            and benefits, the patient was deemed in                            satisfactory condition to undergo the procedure.                           After obtaining informed consent, the  endoscope was                            passed under direct vision. Throughout the                            procedure, the patient's blood pressure, pulse, and                            oxygen saturations were monitored continuously. The                            [id number]-[id number] ([id number]) Olympus gastroscope was                            introduced through the mouth, and advanced to the                            third part of duodenum. The upper GI endoscopy was                            accomplished without difficulty. The patient                            tolerated the procedure well. Scope In: Scope Out: Findings:      The examined esophagus was normal.      Evidence for gastric sleeve anatomy. Gastric mucosa is otherwise normal.      The examined duodenum was normal.      The cardia and gastric fundus were normal on retroflexion.      The exam was otherwise without abnormality. Impression:               - Normal esophagus.                           - Post Gastric sleeve anatomy.                           - Normal stomach mucosa.                           -  Normal examined duodenum.                           - The examination was otherwise normal.                           - No specimens collected. Moderate Sedation:      Not Applicable - Patient had care per Anesthesia. Recommendation:           - Patient has a contact number available for                            emergencies. The signs and symptoms of potential                            delayed complications were discussed with the                            patient. Return to normal activities tomorrow.                            Written discharge instructions were provided to the                            patient.                           - Resume previous diet.                           - Continue present medications.                           - Proceed with colonoscopy as previously planned. Procedure Code(s):         --- Professional ---                           [phone], Esophagogastroduodenoscopy, flexible,                            transoral; diagnostic, including collection of                            specimen(s) by brushing or washing, when performed                            (separate procedure) Diagnosis Code(s):        --- Professional ---                           D50.9, Iron deficiency anemia, unspecified CPT copyright 2019 American Medical Association. All rights reserved. The codes documented in this report are preliminary and upon coder review may  be revised to meet current compliance requirements. Thornton Park MD, MD 03/13/2019 10:27:55 AM This report has been signed electronically. Number of Addenda: 0

## 2019-03-13 NOTE — Telephone Encounter (Signed)
A referral to [facility] has been made and records faxed via Epic.

## 2019-03-13 NOTE — Telephone Encounter (Signed)
-----   Message from Tressia Danas, MD sent at 03/13/2019 10:43 AM EST ----- Please arrange referral to surgery for bleeding, prolapsing hemorrhoids. Thank you.

## 2019-03-13 NOTE — Anesthesia Preprocedure Evaluation (Addendum)
Anesthesia Evaluation  Patient identified by MRN, date of birth, ID band Patient awake    Reviewed: Allergy & Precautions, NPO status , Patient's Chart, lab work & pertinent test results  Airway Mallampati: II  TM Distance: >3 FB Neck ROM: Full    Dental no notable dental hx. (+) Teeth Intact, Dental Advisory Given   Pulmonary sleep apnea and Continuous Positive Airway Pressure Ventilation ,    Pulmonary exam normal breath sounds clear to auscultation       Cardiovascular hypertension, Pt. on medications Normal cardiovascular exam Rhythm:Regular Rate:Normal     Neuro/Psych PSYCHIATRIC DISORDERS Depression negative neurological ROS     GI/Hepatic Neg liver ROS, S/p gastric sleeve  Rectal bleeding   Endo/Other  Morbid obesityBMI 51  Renal/GU negative Renal ROS  negative genitourinary   Musculoskeletal Chronic neck and LBP   Abdominal (+) + obese,   Peds  Hematology  (+) Blood dyscrasia, anemia ,   Anesthesia Other Findings Day of surgery medications reviewed with the patient.  Reproductive/Obstetrics negative OB ROS                           Anesthesia Physical Anesthesia Plan  ASA: III  Anesthesia Plan: MAC   Post-op Pain Management:    Induction:   PONV Risk Score and Plan: 2 and Propofol infusion and TIVA  Airway Management Planned: Natural Airway and Simple Face Mask  Additional Equipment: None  Intra-op Plan:   Post-operative Plan:   Informed Consent: I have reviewed the patients History and Physical, chart, labs and discussed the procedure including the risks, benefits and alternatives for the proposed anesthesia with the patient or authorized representative who has indicated his/her understanding and acceptance.       Plan Discussed with: CRNA  Anesthesia Plan Comments: (Unable to remove lip and nose piercings, will pad lip piercing with gauze after bite block  insertion. Nose piercing taped.)       Anesthesia Quick Evaluation

## 2019-03-13 NOTE — Discharge Instructions (Signed)
YOU HAD AN ENDOSCOPIC PROCEDURE TODAY: Refer to the procedure report and other information in the discharge instructions given to you for any specific questions about what was found during the examination. If this information does not answer your questions, please call [place] office at [phone] to clarify.  ° °YOU SHOULD EXPECT: Some feelings of bloating in the abdomen. Passage of more gas than usual. Walking can help get rid of the air that was put into your GI tract during the procedure and reduce the bloating. If you had a lower endoscopy (such as a colonoscopy or flexible sigmoidoscopy) you may notice spotting of blood in your stool or on the toilet paper. Some abdominal soreness may be present for a day or two, also. ° °DIET: Your first meal following the procedure should be a light meal and then it is ok to progress to your normal diet. A half-sandwich or bowl of soup is an example of a good first meal. Heavy or fried foods are harder to digest and may make you feel nauseous or bloated. Drink plenty of fluids but you should avoid alcoholic beverages for 24 hours.  ° °ACTIVITY: Your care partner should take you home directly after the procedure. You should plan to take it easy, moving slowly for the rest of the day. You can resume normal activity the day after the procedure however YOU SHOULD NOT DRIVE, use power tools, machinery or perform tasks that involve climbing or major physical exertion for 24 hours (because of the sedation medicines used during the test).  ° °SYMPTOMS TO REPORT IMMEDIATELY: °A gastroenterologist can be reached at any hour. Please call [phone]  for any of the following symptoms:  °Following lower endoscopy (colonoscopy, flexible sigmoidoscopy) °Excessive amounts of blood in the stool  °Significant tenderness, worsening of abdominal pains  °Swelling of the abdomen that is new, acute  °Fever of 100° or higher  °Following upper endoscopy (EGD, EUS, ERCP, esophageal  dilation) °Vomiting of blood or coffee ground material  °New, significant abdominal pain  °New, significant chest pain or pain under the shoulder blades  °Painful or persistently difficult swallowing  °New shortness of breath  °Black, tarry-looking or red, bloody stools ° °FOLLOW UP:  °If any biopsies were taken you will be contacted by phone or by letter within the next 1-3 weeks. Call [phone]  if you have not heard about the biopsies in 3 weeks.  °Please also call with any specific questions about appointments or follow up tests.  °

## 2019-03-14 ENCOUNTER — Encounter: Payer: Self-pay | Admitting: Gastroenterology

## 2019-03-14 ENCOUNTER — Encounter: Payer: Self-pay | Admitting: *Deleted

## 2019-03-14 LAB — SURGICAL PATHOLOGY

## 2019-04-02 ENCOUNTER — Ambulatory Visit: Payer: Self-pay | Admitting: General Surgery

## 2019-04-02 NOTE — H&P (Signed)
The patient is a 39 year old female who presents with a complaint of Rectal bleeding. 39-year-old female who presents to the office for evaluation of rectal bleeding. She states that this has been going on for several years. She has been diagnosed with hemorrhoids in the past. She has tried medical therapies. Her bowel ligation as also been attempted, but aborted due to an anal fissure. Her anal fissure has now healed and she continues to have significant rectal bleeding. She reports regular bowel movements and denies any constipation or straining. Colonoscopy performed earlier this month shows one small polyp in the sigmoid colon, but otherwise no other source for bleeding   Past Surgical History (Jacqueline Haggett, RMA; 04/02/2019 10:29 AM) Sleeve Gastrectomy Spinal Surgery - Neck  Diagnostic Studies History (Jacqueline Haggett, RMA; 04/02/2019 10:29 AM) Colonoscopy within last year Mammogram within last year  Allergies (Jacqueline Haggett, RMA; 04/02/2019 10:30 AM) Lisinopril *CHEMICALS* Swelling. levoFLOXacin *CHEMICALS* Rash. Latex Itching, Rash. Allergies Reconciled  Medication History (Jacqueline Haggett, RMA; 04/02/2019 10:35 AM) Acetaminophen (500MG Tablet, Oral) Active. amLODIPine Besylate (10MG Tablet, Oral) Active. Bisoprolol-hydroCHLOROthiazide (5-6.25MG Tablet, Oral) Active. BuSpar (10MG Tablet, Oral) Active. Calcium Carb-Cholecalciferol (Oral) Specific strength unknown - Active. Cholecalciferol (25 MCG(1000 UT) Capsule, Oral) Active. Clindamycin Phos-Benzoyl Perox (1-5% Gel, External) Active. Clotrimazole (1% Cream, External) Active. Benadryl Allergy (25MG Tablet, Oral) Active. Colace (Oral) Specific strength unknown - Active. Ferrous Sulfate (325 (65 Fe)MG Tablet, Oral) Active. Hydrocortisone (2.5% Cream, External) Active. LORazepam (1MG Tablet, Oral) Active. Meclizine HCl (25MG Tablet, Oral) Active. Multi Vitamin (Oral) Active. Psyllium  (58.6% Powder, Oral) Active. Medications Reconciled  Social History (Jacqueline Haggett, RMA; 04/02/2019 10:29 AM) No alcohol use No caffeine use No drug use Tobacco use Never smoker.  Family History (Jacqueline Haggett, RMA; 04/02/2019 10:29 AM) Family history unknown First Degree Relatives Hypertension Mother.  Pregnancy / Birth History (Jacqueline Haggett, RMA; 04/02/2019 10:29 AM) Age at menarche 7 years. Contraceptive History Oral contraceptives. Gravida 2 Length (months) of breastfeeding 7-12 Maternal age 15-20 Para 2 Regular periods  Other Problems (Jacqueline Haggett, RMA; 04/02/2019 10:29 AM) Anxiety Disorder Back Pain Depression Gastroesophageal Reflux Disease High blood pressure Sleep Apnea Transfusion history     Review of Systems (Jacqueline Haggett RMA; 04/02/2019 10:29 AM) General Present- Weight Gain. Not Present- Appetite Loss, Chills, Fatigue, Fever, Night Sweats and Weight Loss. HEENT Present- Seasonal Allergies and Wears glasses/contact lenses. Not Present- Earache, Hearing Loss, Hoarseness, Nose Bleed, Oral Ulcers, Ringing in the Ears, Sinus Pain, Sore Throat, Visual Disturbances and Yellow Eyes. Respiratory Present- Snoring. Not Present- Bloody sputum, Chronic Cough, Difficulty Breathing and Wheezing. Cardiovascular Present- Swelling of Extremities. Not Present- Chest Pain, Difficulty Breathing Lying Down, Leg Cramps, Palpitations, Rapid Heart Rate and Shortness of Breath. Gastrointestinal Present- Hemorrhoids and Rectal Pain. Not Present- Abdominal Pain, Bloating, Bloody Stool, Change in Bowel Habits, Chronic diarrhea, Constipation, Difficulty Swallowing, Excessive gas, Gets full quickly at meals, Indigestion, Nausea and Vomiting. Female Genitourinary Not Present- Frequency, Nocturia, Painful Urination, Pelvic Pain and Urgency. Musculoskeletal Present- Back Pain. Not Present- Joint Pain, Joint Stiffness, Muscle Pain, Muscle Weakness  and Swelling of Extremities. Psychiatric Present- Anxiety. Not Present- Bipolar, Change in Sleep Pattern, Depression, Fearful and Frequent crying.  Vitals (Jacqueline Haggett RMA; 04/02/2019 10:36 AM) 04/02/2019 10:35 AM Weight: 326.2 lb Height: 67in Body Surface Area: 2.49 m Body Mass Index: 51.09 kg/m  Temp.: 98.7F(Temporal)  Pulse: 99 (Regular)  P.OX: 99% (Room air) BP: 162/100 (Sitting, Left Wrist, Standard)        Physical Exam  General Mental   Status-Alert. General Appearance-Cooperative.  Abdomen Palpation/Percussion Palpation and Percussion of the abdomen reveal - Soft and Non Tender.  Rectal Anorectal Exam External - skin tag, no anal fissures. Internal - normal sphincter tone and generalized tenderness.  ANOSCOPY, DIAGNOSTIC (52074) [ Hemorrhoids ] Procedure Other: Procedure: Anoscopy....Marland KitchenMarland KitchenSurgeon: Maisie Fus....Marland KitchenMarland KitchenAfter the risks and benefits were explained, verbal consent was obtained for above procedure. A medical assistant chaperone was present thoroughout the entire procedure. ....Marland KitchenMarland KitchenAnesthesia: none....Marland KitchenMarland KitchenDiagnosis: rectal bleeding....Marland KitchenMarland KitchenFindings: Grade 2 left lateral and right posterior internal hemorrhoids, grade 3 right anterior internal hemorrhoid  Performed: 04/02/2019 10:58 AM    Assessment & Plan  PROLAPSED INTERNAL HEMORRHOIDS, GRADE 3 (K64.2) Impression: On exam today, patient has a grade 3 right anterior prolapsed internal hemorrhoid. I believe this is the source of most of her bleeding. We discussed surgical options including hemorrhoidectomy as well as trans hemorrhoidal arterialization. We have decided to proceed with THD. We discussed that there is a small risk of recurrent bleeding and prolapse. I think this is the best and most reasonable option to get rid of her rectal bleeding. We also discussed her hypertension possibly worsening her rectal bleeding. I recommended that she discuss this with her primary care physician and  see if they can get this under better control.

## 2019-04-02 NOTE — H&P (View-Only) (Signed)
The patient is a 39 year old female who presents with a complaint of Rectal bleeding. 39 year old female who presents to the office for evaluation of rectal bleeding. She states that this has been going on for several years. She has been diagnosed with hemorrhoids in the past. She has tried medical therapies. Her bowel ligation as also been attempted, but aborted due to an anal fissure. Her anal fissure has now healed and she continues to have significant rectal bleeding. She reports regular bowel movements and denies any constipation or straining. Colonoscopy performed earlier this month shows one small polyp in the sigmoid colon, but otherwise no other source for bleeding   Past Surgical History Geni Bers [place], RMA; 04/02/2019 10:29 AM) Sleeve Gastrectomy Spinal Surgery - Neck  Diagnostic Studies History Geni Bers [place], RMA; 04/02/2019 10:29 AM) Colonoscopy within last year Mammogram within last year  Allergies Geni Bers Haggett, RMA; 04/02/2019 10:30 AM) Lisinopril *CHEMICALS* Swelling. levoFLOXacin *CHEMICALS* Rash. Latex Itching, Rash. Allergies Reconciled  Medication History [employer], RMA; 04/02/2019 10:35 AM) Acetaminophen (500MG  Tablet, Oral) Active. amLODIPine Besylate (10MG  Tablet, Oral) Active. Bisoprolol-hydroCHLOROthiazide (5-6.25MG  Tablet, Oral) Active. BuSpar (10MG  Tablet, Oral) Active. Calcium Carb-Cholecalciferol (Oral) Specific strength unknown - Active. Cholecalciferol (25 MCG(1000 UT) Capsule, Oral) Active. Clindamycin Phos-Benzoyl Perox (1-5% Gel, External) Active. Clotrimazole (1% Cream, External) Active. Benadryl Allergy (25MG  Tablet, Oral) Active. Colace (Oral) Specific strength unknown - Active. Ferrous Sulfate (325 (65 Fe)MG Tablet, Oral) Active. Hydrocortisone (2.5% Cream, External) Active. LORazepam (1MG  Tablet, Oral) Active. Meclizine HCl (25MG  Tablet, Oral) Active. Multi Vitamin (Oral) Active. Psyllium  (58.6% Powder, Oral) Active. Medications Reconciled  Social History Geni Bers [place], RMA; 04/02/2019 10:29 AM) No alcohol use No caffeine use No drug use Tobacco use Never smoker.  Family History Geni Bers [place], RMA; 04/02/2019 10:29 AM) Family history unknown First Degree Relatives Hypertension Mother.  Pregnancy / Birth History Geni Bers [place], RMA; 04/02/2019 10:29 AM) Age at menarche 72 years. Contraceptive History Oral contraceptives. Gravida 2 Length (months) of breastfeeding 7-12 Maternal age 20-20 Para 2 Regular periods  Other Problems Geni Bers [place], RMA; 04/02/2019 10:29 AM) Anxiety Disorder Back Pain Depression Gastroesophageal Reflux Disease High blood pressure Sleep Apnea Transfusion history     Review of Systems Geni Bers Haggett RMA; 04/02/2019 10:29 AM) General Present- Weight Gain. Not Present- Appetite Loss, Chills, Fatigue, Fever, Night Sweats and Weight Loss. HEENT Present- Seasonal Allergies and Wears glasses/contact lenses. Not Present- Earache, Hearing Loss, Hoarseness, Nose Bleed, Oral Ulcers, Ringing in the Ears, Sinus Pain, Sore Throat, Visual Disturbances and Yellow Eyes. Respiratory Present- Snoring. Not Present- Bloody sputum, Chronic Cough, Difficulty Breathing and Wheezing. Cardiovascular Present- Swelling of Extremities. Not Present- Chest Pain, Difficulty Breathing Lying Down, Leg Cramps, Palpitations, Rapid Heart Rate and Shortness of Breath. Gastrointestinal Present- Hemorrhoids and Rectal Pain. Not Present- Abdominal Pain, Bloating, Bloody Stool, Change in Bowel Habits, Chronic diarrhea, Constipation, Difficulty Swallowing, Excessive gas, Gets full quickly at meals, Indigestion, Nausea and Vomiting. Female Genitourinary Not Present- Frequency, Nocturia, Painful Urination, Pelvic Pain and Urgency. Musculoskeletal Present- Back Pain. Not Present- Joint Pain, Joint Stiffness, Muscle Pain, Muscle Weakness  and Swelling of Extremities. Psychiatric Present- Anxiety. Not Present- Bipolar, Change in Sleep Pattern, Depression, Fearful and Frequent crying.  Vitals [employer] Haggett RMA; 04/02/2019 10:36 AM) 04/02/2019 10:35 AM Weight: 326.2 lb Height: 67in Body Surface Area: 2.49 m Body Mass Index: 51.09 kg/m  Temp.: 98.93F(Temporal)  Pulse: 99 (Regular)  P.OX: 99% (Room air) BP: 162/100 (Sitting, Left Wrist, Standard)        Physical Exam  General Mental  Status-Alert. General Appearance-Cooperative.  Abdomen Palpation/Percussion Palpation and Percussion of the abdomen reveal - Soft and Non Tender.  Rectal Anorectal Exam External - skin tag, no anal fissures. Internal - normal sphincter tone and generalized tenderness.  ANOSCOPY, DIAGNOSTIC (52074) [ Hemorrhoids ] Procedure Other: Procedure: Anoscopy....Marland KitchenMarland KitchenSurgeon: Maisie Fus....Marland KitchenMarland KitchenAfter the risks and benefits were explained, verbal consent was obtained for above procedure. A medical assistant chaperone was present thoroughout the entire procedure. ....Marland KitchenMarland KitchenAnesthesia: none....Marland KitchenMarland KitchenDiagnosis: rectal bleeding....Marland KitchenMarland KitchenFindings: Grade 2 left lateral and right posterior internal hemorrhoids, grade 3 right anterior internal hemorrhoid  Performed: 04/02/2019 10:58 AM    Assessment & Plan  PROLAPSED INTERNAL HEMORRHOIDS, GRADE 3 (K64.2) Impression: On exam today, patient has a grade 3 right anterior prolapsed internal hemorrhoid. I believe this is the source of most of her bleeding. We discussed surgical options including hemorrhoidectomy as well as trans hemorrhoidal arterialization. We have decided to proceed with THD. We discussed that there is a small risk of recurrent bleeding and prolapse. I think this is the best and most reasonable option to get rid of her rectal bleeding. We also discussed her hypertension possibly worsening her rectal bleeding. I recommended that she discuss this with her primary care physician and  see if they can get this under better control.

## 2019-04-05 NOTE — Progress Notes (Signed)
PCP - [facility], no PCP noted Cardiologist -   Chest x-ray -  EKG -  Stress Test -  ECHO -  Cardiac Cath -   Sleep Study -  CPAP -   Fasting Blood Sugar -  Checks Blood Sugar _____ times a day  Blood Thinner Instructions: Aspirin Instructions: Last Dose:  Anesthesia review:   Patient denies shortness of breath, fever, cough and chest pain at PAT appointment   Patient verbalized understanding of instructions that were given to them at the PAT appointment. Patient was also instructed that they will need to review over the PAT instructions again at home before surgery.

## 2019-04-05 NOTE — Patient Instructions (Addendum)
DUE TO COVID-19 ONLY ONE VISITOR IS ALLOWED TO COME WITH YOU AND STAY IN THE WAITING ROOM ONLY DURING PRE OP AND PROCEDURE DAY OF SURGERY. THE 1 VISITOR MAY VISIT WITH YOU AFTER SURGERY IN YOUR PRIVATE ROOM DURING VISITING HOURS ONLY!  YOU NEED TO HAVE A COVID 19 TEST ON 04-09-19 @ 9:25 AM, THIS TEST MUST BE DONE BEFORE SURGERY, COME  [address], [place] , [address].  ([place]) ONCE YOUR COVID TEST IS COMPLETED, PLEASE BEGIN THE QUARANTINE INSTRUCTIONS AS OUTLINED IN YOUR HANDOUT.                ARTIE TAKAYAMA  04/05/2019   Your procedure is scheduled on: 04-12-19   Report to [facility] Main  Entrance    Report to Admitting at 11:45 AM     Call this number if you have problems the morning of surgery [phone]    Remember: Do not eat food or drink liquids :After Midnight.  After midnight you may have Clear Liquids until 7:45 AM. After 7:45 AM, nothing until after surgery.     CLEAR LIQUID DIET   Foods Allowed                                                                     Foods Excluded  Coffee and tea, regular and decaf                             liquids that you cannot  Plain Jell-O any favor except red or purple                                           see through such as: Fruit ices (not with fruit pulp)                                     milk, soups, orange juice  Iced Popsicles                                    All solid food Carbonated beverages, regular and diet                                    Cranberry, grape and apple juices Sports drinks like Gatorade Lightly seasoned clear broth or consume(fat free) Sugar, honey syrup   _____________________________________________________________________     Take these medicines the morning of surgery with A SIP OF WATER: Amlodipine (Norvasc), Bupropion (Wellbutrin), and Buspirone (Buspar)  BRUSH YOUR TEETH MORNING OF SURGERY AND RINSE YOUR MOUTH OUT, NO CHEWING GUM CANDY OR  MINTS.                              You may not have any metal on your body including hair pins and  piercings     Do not wear jewelry, make-up, lotions, powders or perfumes, deodorant              Do not wear nail polish on your fingernails.  Do not shave 48 hours prior to surgery.       Do not bring valuables to the hospital. [facility] IS NOT             RESPONSIBLE   FOR VALUABLES.  Contacts, dentures or bridgework may not be worn into surgery.      Patients discharged the day of surgery will not be allowed to drive home. IF YOU ARE HAVING SURGERY AND GOING HOME THE SAME DAY, YOU MUST HAVE AN ADULT TO DRIVE YOU HOME AND BE WITH YOU FOR 24 HOURS. YOU MAY GO HOME BY TAXI OR UBER OR ORTHERWISE, BUT AN ADULT MUST ACCOMPANY YOU HOME AND STAY WITH YOU FOR 24 HOURS.  Name and phone number of your driver:Felicia Broadus John [phone]  Special Instructions: N/A              Please read over the following fact sheets you were given: _____________________________________________________________________             [facility] - Preparing for Surgery Before surgery, you can play an important role.  Because skin is not sterile, your skin needs to be as free of germs as possible.  You can reduce the number of germs on your skin by washing with CHG (chlorahexidine gluconate) soap before surgery.  CHG is an antiseptic cleaner which kills germs and bonds with the skin to continue killing germs even after washing. Please DO NOT use if you have an allergy to CHG or antibacterial soaps.  If your skin becomes reddened/irritated stop using the CHG and inform your nurse when you arrive at Short Stay. Do not shave (including legs and underarms) for at least 48 hours prior to the first CHG shower.  You may shave your face/neck. Please follow these instructions carefully:  1.  Shower with CHG Soap the night before surgery and the  morning of Surgery.  2.  If you choose to wash your hair, wash  your hair first as usual with your  normal  shampoo.  3.  After you shampoo, rinse your hair and body thoroughly to remove the  shampoo.                           4.  Use CHG as you would any other liquid soap.  You can apply chg directly  to the skin and wash                       Gently with a scrungie or clean washcloth.  5.  Apply the CHG Soap to your body ONLY FROM THE NECK DOWN.   Do not use on face/ open                           Wound or open sores. Avoid contact with eyes, ears mouth and genitals (private parts).                       Wash face,  Genitals (private parts) with your normal soap.             6.  Wash thoroughly, paying special attention to the area where your surgery  will be performed.  7.  Thoroughly rinse your body with warm water from the neck down.  8.  DO NOT shower/wash with your normal soap after using and rinsing off  the CHG Soap.                9.  Pat yourself dry with a clean towel.            10.  Wear clean pajamas.            11.  Place clean sheets on your bed the night of your first shower and do not  sleep with pets. Day of Surgery : Do not apply any lotions/deodorants the morning of surgery.  Please wear clean clothes to the hospital/surgery center.  FAILURE TO FOLLOW THESE INSTRUCTIONS MAY RESULT IN THE CANCELLATION OF YOUR SURGERY PATIENT SIGNATURE_________________________________  NURSE SIGNATURE__________________________________  ________________________________________________________________________

## 2019-04-09 ENCOUNTER — Encounter (HOSPITAL_COMMUNITY): Payer: Self-pay

## 2019-04-09 ENCOUNTER — Other Ambulatory Visit: Payer: Self-pay

## 2019-04-09 ENCOUNTER — Encounter (HOSPITAL_COMMUNITY)
Admission: RE | Admit: 2019-04-09 | Discharge: 2019-04-09 | Disposition: A | Discharge status: Home / Self Care | Payer: Medicaid Other | Source: Ambulatory Visit | Attending: General Surgery | Admitting: General Surgery

## 2019-04-09 ENCOUNTER — Other Ambulatory Visit (HOSPITAL_COMMUNITY)
Admission: RE | Admit: 2019-04-09 | Discharge: 2019-04-09 | Disposition: A | Discharge status: Home / Self Care | Payer: Medicaid Other | Source: Ambulatory Visit | Attending: General Surgery | Admitting: General Surgery

## 2019-04-09 DIAGNOSIS — Z20822 Contact with and (suspected) exposure to covid-19: Secondary | ICD-10-CM | POA: Insufficient documentation

## 2019-04-09 DIAGNOSIS — I10 Essential (primary) hypertension: Secondary | ICD-10-CM | POA: Insufficient documentation

## 2019-04-09 LAB — BASIC METABOLIC PANEL
Anion gap: 7 (ref 5–15)
BUN: 7 mg/dL (ref 6–20)
CO2: 29 mmol/L (ref 22–32)
Calcium: 8.8 mg/dL — ABNORMAL LOW (ref 8.9–10.3)
Chloride: 105 mmol/L (ref 98–111)
Creatinine, Ser: 0.81 mg/dL (ref 0.44–1.00)
GFR calc Af Amer: >60 mL/min (ref >60)
GFR calc non Af Amer: >60 mL/min (ref >60)
Glucose, Bld: 92 mg/dL (ref 70–99)
Potassium: 3.6 mmol/L (ref 3.5–5.1)
Sodium: 141 mmol/L (ref 135–145)

## 2019-04-09 LAB — CBC
HCT: 34.5 % — ABNORMAL LOW (ref 36.0–46.0)
Hemoglobin: 10.3 g/dL — ABNORMAL LOW (ref 12.0–15.0)
MCH: 23 pg — ABNORMAL LOW (ref 26.0–34.0)
MCHC: 29.9 g/dL — ABNORMAL LOW (ref 30.0–36.0)
MCV: 77 fL — ABNORMAL LOW (ref 80.0–100.0)
Platelets: 274 10*3/uL (ref 150–400)
RBC: 4.48 MIL/uL (ref 3.87–5.11)
RDW: 16.1 % — ABNORMAL HIGH (ref 11.5–15.5)
WBC: 7 10*3/uL (ref 4.0–10.5)
nRBC: 0 % (ref 0.0–0.2)

## 2019-04-09 LAB — SARS CORONAVIRUS 2 (TAT 6-24 HRS): SARS Coronavirus 2: NEGATIVE

## 2019-04-11 MED ORDER — BUPIVACAINE LIPOSOME 1.3 % IJ SUSP
20.0000 mL | Freq: Once | INTRAMUSCULAR | Status: DC
  Filled 2019-04-11: qty 20

## 2019-04-12 ENCOUNTER — Encounter (HOSPITAL_COMMUNITY)
Admission: RE | Disposition: A | Discharge status: Home / Self Care | Payer: Self-pay | Source: Ambulatory Visit | Attending: General Surgery

## 2019-04-12 ENCOUNTER — Ambulatory Visit (HOSPITAL_COMMUNITY)
Admission: RE | Admit: 2019-04-12 | Discharge: 2019-04-12 | Disposition: A | Discharge status: Home / Self Care | Payer: Medicaid Other | Source: Ambulatory Visit | Attending: General Surgery | Admitting: General Surgery

## 2019-04-12 ENCOUNTER — Ambulatory Visit (HOSPITAL_COMMUNITY): Payer: Medicaid Other | Admitting: Certified Registered"

## 2019-04-12 ENCOUNTER — Ambulatory Visit (HOSPITAL_COMMUNITY): Payer: Medicaid Other | Admitting: Physician Assistant

## 2019-04-12 ENCOUNTER — Encounter (HOSPITAL_COMMUNITY): Payer: Self-pay | Admitting: General Surgery

## 2019-04-12 ENCOUNTER — Other Ambulatory Visit: Payer: Self-pay

## 2019-04-12 ENCOUNTER — Ambulatory Visit: Payer: Medicaid Other | Admitting: Gastroenterology

## 2019-04-12 DIAGNOSIS — F329 Major depressive disorder, single episode, unspecified: Secondary | ICD-10-CM | POA: Insufficient documentation

## 2019-04-12 DIAGNOSIS — I10 Essential (primary) hypertension: Secondary | ICD-10-CM | POA: Diagnosis not present

## 2019-04-12 DIAGNOSIS — F419 Anxiety disorder, unspecified: Secondary | ICD-10-CM | POA: Diagnosis not present

## 2019-04-12 DIAGNOSIS — G4730 Sleep apnea, unspecified: Secondary | ICD-10-CM | POA: Diagnosis not present

## 2019-04-12 DIAGNOSIS — Z888 Allergy status to other drugs, medicaments and biological substances status: Secondary | ICD-10-CM | POA: Diagnosis not present

## 2019-04-12 DIAGNOSIS — Z881 Allergy status to other antibiotic agents status: Secondary | ICD-10-CM | POA: Insufficient documentation

## 2019-04-12 DIAGNOSIS — Z91040 Latex allergy status: Secondary | ICD-10-CM | POA: Diagnosis not present

## 2019-04-12 DIAGNOSIS — K625 Hemorrhage of anus and rectum: Secondary | ICD-10-CM | POA: Insufficient documentation

## 2019-04-12 DIAGNOSIS — Z9884 Bariatric surgery status: Secondary | ICD-10-CM | POA: Insufficient documentation

## 2019-04-12 DIAGNOSIS — Z6841 Body Mass Index (BMI) 40.0 and over, adult: Secondary | ICD-10-CM | POA: Diagnosis not present

## 2019-04-12 DIAGNOSIS — Z79899 Other long term (current) drug therapy: Secondary | ICD-10-CM | POA: Diagnosis not present

## 2019-04-12 DIAGNOSIS — K642 Third degree hemorrhoids: Secondary | ICD-10-CM | POA: Diagnosis not present

## 2019-04-12 DIAGNOSIS — K219 Gastro-esophageal reflux disease without esophagitis: Secondary | ICD-10-CM | POA: Diagnosis not present

## 2019-04-12 HISTORY — PX: TRANSANAL HEMORRHOIDAL DEARTERIALIZATION: SHX6136

## 2019-04-12 LAB — PREGNANCY, URINE: Preg Test, Ur: NEGATIVE

## 2019-04-12 SURGERY — TRANSANAL HEMORRHOIDAL DEARTERIALIZATION
Anesthesia: General | Site: Rectum

## 2019-04-12 MED ORDER — OXYCODONE HCL 5 MG PO TABS: 5.0000 mg | ORAL_TABLET | Freq: Four times a day (QID) | ORAL | 0 refills | Status: DC | PRN

## 2019-04-12 MED ORDER — SUGAMMADEX SODIUM 200 MG/2ML IV SOLN
INTRAVENOUS | Status: DC | PRN
  Administered 2019-04-12: 300 mg via INTRAVENOUS

## 2019-04-12 MED ORDER — OXYCODONE HCL 5 MG PO TABS: 5.0000 mg | ORAL_TABLET | Freq: Once | ORAL | Status: DC | PRN

## 2019-04-12 MED ORDER — MIDAZOLAM HCL 2 MG/2ML IJ SOLN
INTRAMUSCULAR | Status: DC | PRN
  Administered 2019-04-12 (×2): 1 mg via INTRAVENOUS

## 2019-04-12 MED ORDER — MIDAZOLAM HCL 2 MG/2ML IJ SOLN
INTRAMUSCULAR | Status: AC
  Filled 2019-04-12: qty 2

## 2019-04-12 MED ORDER — PHENYLEPHRINE 40 MCG/ML (10ML) SYRINGE FOR IV PUSH (FOR BLOOD PRESSURE SUPPORT)
PREFILLED_SYRINGE | INTRAVENOUS | Status: AC
  Filled 2019-04-12: qty 10

## 2019-04-12 MED ORDER — OXYCODONE HCL 5 MG PO TABS: 5.0000 mg | ORAL_TABLET | ORAL | Status: DC | PRN

## 2019-04-12 MED ORDER — SUGAMMADEX SODIUM 500 MG/5ML IV SOLN
INTRAVENOUS | Status: AC
  Filled 2019-04-12: qty 5

## 2019-04-12 MED ORDER — CELECOXIB 200 MG PO CAPS
200.0000 mg | ORAL_CAPSULE | ORAL | Status: AC
  Administered 2019-04-12: 12:00:00 200 mg via ORAL
  Filled 2019-04-12: qty 1

## 2019-04-12 MED ORDER — SODIUM CHLORIDE 0.9 % IV SOLN: 250.0000 mL | INTRAVENOUS | Status: DC | PRN

## 2019-04-12 MED ORDER — 0.9 % SODIUM CHLORIDE (POUR BTL) OPTIME
TOPICAL | Status: DC | PRN
  Administered 2019-04-12: 1000 mL

## 2019-04-12 MED ORDER — FENTANYL CITRATE (PF) 100 MCG/2ML IJ SOLN
INTRAMUSCULAR | Status: AC
  Filled 2019-04-12: qty 2

## 2019-04-12 MED ORDER — ACETAMINOPHEN 325 MG PO TABS: 650.0000 mg | ORAL_TABLET | ORAL | Status: DC | PRN

## 2019-04-12 MED ORDER — DEXAMETHASONE SODIUM PHOSPHATE 10 MG/ML IJ SOLN
INTRAMUSCULAR | Status: AC
  Filled 2019-04-12: qty 1

## 2019-04-12 MED ORDER — ROCURONIUM BROMIDE 10 MG/ML (PF) SYRINGE
PREFILLED_SYRINGE | INTRAVENOUS | Status: DC | PRN
  Administered 2019-04-12: 50 mg via INTRAVENOUS

## 2019-04-12 MED ORDER — ONDANSETRON HCL 4 MG/2ML IJ SOLN
INTRAMUSCULAR | Status: DC | PRN
  Administered 2019-04-12: 4 mg via INTRAVENOUS

## 2019-04-12 MED ORDER — LIDOCAINE 2% (20 MG/ML) 5 ML SYRINGE
INTRAMUSCULAR | Status: DC | PRN
  Administered 2019-04-12: 40 mg via INTRAVENOUS

## 2019-04-12 MED ORDER — SCOPOLAMINE 1 MG/3DAYS TD PT72: 1.0000 | MEDICATED_PATCH | TRANSDERMAL | Status: DC

## 2019-04-12 MED ORDER — BUPIVACAINE HCL (PF) 0.25 % IJ SOLN
INTRAMUSCULAR | Status: DC | PRN
  Administered 2019-04-12: 20 mL

## 2019-04-12 MED ORDER — SODIUM CHLORIDE 0.9% FLUSH: 3.0000 mL | INTRAVENOUS | Status: DC | PRN

## 2019-04-12 MED ORDER — BUPIVACAINE LIPOSOME 1.3 % IJ SUSP
INTRAMUSCULAR | Status: DC | PRN
  Administered 2019-04-12: 20 mL

## 2019-04-12 MED ORDER — DEXAMETHASONE SODIUM PHOSPHATE 10 MG/ML IJ SOLN
INTRAMUSCULAR | Status: DC | PRN
  Administered 2019-04-12: 4 mg via INTRAVENOUS

## 2019-04-12 MED ORDER — BUPIVACAINE HCL (PF) 0.25 % IJ SOLN
INTRAMUSCULAR | Status: AC
  Filled 2019-04-12: qty 30

## 2019-04-12 MED ORDER — SUCCINYLCHOLINE CHLORIDE 200 MG/10ML IV SOSY
PREFILLED_SYRINGE | INTRAVENOUS | Status: DC | PRN
  Administered 2019-04-12: 160 mg via INTRAVENOUS

## 2019-04-12 MED ORDER — FENTANYL CITRATE (PF) 250 MCG/5ML IJ SOLN
INTRAMUSCULAR | Status: DC | PRN
  Administered 2019-04-12 (×3): 50 ug via INTRAVENOUS

## 2019-04-12 MED ORDER — PROPOFOL 10 MG/ML IV BOLUS
INTRAVENOUS | Status: AC
  Filled 2019-04-12: qty 20

## 2019-04-12 MED ORDER — SCOPOLAMINE 1 MG/3DAYS TD PT72
MEDICATED_PATCH | TRANSDERMAL | Status: AC
  Administered 2019-04-12: 1.5 mg via TRANSDERMAL
  Filled 2019-04-12: qty 1

## 2019-04-12 MED ORDER — PROMETHAZINE HCL 25 MG/ML IJ SOLN: 6.2500 mg | INTRAMUSCULAR | Status: DC | PRN

## 2019-04-12 MED ORDER — ROCURONIUM BROMIDE 10 MG/ML (PF) SYRINGE
PREFILLED_SYRINGE | INTRAVENOUS | Status: AC
  Filled 2019-04-12: qty 10

## 2019-04-12 MED ORDER — ONDANSETRON HCL 4 MG/2ML IJ SOLN
INTRAMUSCULAR | Status: AC
  Filled 2019-04-12: qty 2

## 2019-04-12 MED ORDER — ACETAMINOPHEN 650 MG RE SUPP
650.0000 mg | RECTAL | Status: DC | PRN
  Filled 2019-04-12: qty 1

## 2019-04-12 MED ORDER — LACTATED RINGERS IV SOLN: INTRAVENOUS | Status: DC

## 2019-04-12 MED ORDER — GABAPENTIN 300 MG PO CAPS
300.0000 mg | ORAL_CAPSULE | ORAL | Status: AC
  Administered 2019-04-12: 12:00:00 300 mg via ORAL
  Filled 2019-04-12: qty 1

## 2019-04-12 MED ORDER — FENTANYL CITRATE (PF) 100 MCG/2ML IJ SOLN
25.0000 ug | INTRAMUSCULAR | Status: DC | PRN
  Administered 2019-04-12 (×2): 50 ug via INTRAVENOUS

## 2019-04-12 MED ORDER — ACETAMINOPHEN 500 MG PO TABS
1000.0000 mg | ORAL_TABLET | ORAL | Status: AC
  Administered 2019-04-12: 12:00:00 1000 mg via ORAL
  Filled 2019-04-12: qty 2

## 2019-04-12 MED ORDER — PROPOFOL 10 MG/ML IV BOLUS
INTRAVENOUS | Status: DC | PRN
  Administered 2019-04-12: 200 mg via INTRAVENOUS

## 2019-04-12 MED ORDER — SUCCINYLCHOLINE CHLORIDE 200 MG/10ML IV SOSY
PREFILLED_SYRINGE | INTRAVENOUS | Status: AC
  Filled 2019-04-12: qty 10

## 2019-04-12 MED ORDER — OXYCODONE HCL 5 MG/5ML PO SOLN: 5.0000 mg | Freq: Once | ORAL | Status: DC | PRN

## 2019-04-12 MED ORDER — PHENYLEPHRINE HCL-NACL 10-0.9 MG/250ML-% IV SOLN
INTRAVENOUS | Status: DC | PRN
  Administered 2019-04-12: 20 ug/min via INTRAVENOUS

## 2019-04-12 MED ORDER — SODIUM CHLORIDE 0.9% FLUSH: 3.0000 mL | Freq: Two times a day (BID) | INTRAVENOUS | Status: DC

## 2019-04-12 MED ORDER — LIDOCAINE 2% (20 MG/ML) 5 ML SYRINGE
INTRAMUSCULAR | Status: AC
  Filled 2019-04-12: qty 5

## 2019-04-12 MED ORDER — PHENYLEPHRINE 40 MCG/ML (10ML) SYRINGE FOR IV PUSH (FOR BLOOD PRESSURE SUPPORT)
PREFILLED_SYRINGE | INTRAVENOUS | Status: DC | PRN
  Administered 2019-04-12 (×3): 40 ug via INTRAVENOUS

## 2019-04-12 SURGICAL SUPPLY — 29 items
BLADE HEX COATED 2.75 (ELECTRODE) ×3 IMPLANT
BRIEF STRETCH FOR OB PAD LRG (UNDERPADS AND DIAPERS) IMPLANT
COVER WAND RF STERILE (DRAPES) IMPLANT
DRSG PAD ABDOMINAL 8X10 ST (GAUZE/BANDAGES/DRESSINGS) ×3 IMPLANT
ELECT REM PT RETURN 15FT ADLT (MISCELLANEOUS) ×3 IMPLANT
GAUZE 4X4 16PLY RFD (DISPOSABLE) ×3 IMPLANT
GAUZE SPONGE 4X4 12PLY STRL (GAUZE/BANDAGES/DRESSINGS) ×3 IMPLANT
GLOVE BIO SURGEON STRL SZ 6.5 (GLOVE) ×2 IMPLANT
GLOVE BIO SURGEONS STRL SZ 6.5 (GLOVE) ×1
GLOVE BIOGEL PI IND STRL 7.0 (GLOVE) ×1 IMPLANT
GLOVE BIOGEL PI INDICATOR 7.0 (GLOVE) ×2
GOWN STRL REUS W/TWL XL LVL3 (GOWN DISPOSABLE) ×6 IMPLANT
HEMOSTAT SURGICEL 4X8 (HEMOSTASIS) IMPLANT
HOVERMATT HALF SINGLE USE (PATIENT TRANSFER) ×3 IMPLANT
KIT SLIDE ONE PROLAPS HEMORR (KITS) ×3 IMPLANT
KIT TURNOVER KIT A (KITS) IMPLANT
NEEDLE HYPO 22GX1.5 SAFETY (NEEDLE) ×3 IMPLANT
PACK LITHOTOMY IV (CUSTOM PROCEDURE TRAY) ×3 IMPLANT
PENCIL SMOKE EVACUATOR (MISCELLANEOUS) ×3 IMPLANT
SPONGE HEMORRHOID 8X3CM (HEMOSTASIS) IMPLANT
SPONGE SURGIFOAM ABS GEL 100 (HEMOSTASIS) IMPLANT
SURGILUBE 2OZ TUBE FLIPTOP (MISCELLANEOUS) ×3 IMPLANT
SUT CHROMIC 2 0 SH (SUTURE) IMPLANT
SUT CHROMIC 3 0 SH 27 (SUTURE) IMPLANT
SUT VIC AB 2-0 UR6 27 (SUTURE) IMPLANT
SYR 20ML LL LF (SYRINGE) ×3 IMPLANT
SYR BULB IRRIGATION 50ML (SYRINGE) ×3 IMPLANT
TOWEL OR 17X26 10 PK STRL BLUE (TOWEL DISPOSABLE) ×3 IMPLANT
TOWEL OR NON WOVEN STRL DISP B (DISPOSABLE) ×3 IMPLANT

## 2019-04-12 NOTE — Op Note (Signed)
04/12/2019  1:50 PM  PATIENT:  Stacey Bray  39 y.o. female  Patient Care Team: [facility] as PCP - General (Family Medicine)  PRE-OPERATIVE DIAGNOSIS:  grade 3 hemorrhoids, rectal bleeding  POST-OPERATIVE DIAGNOSIS:  grade 3 hemorrhoids, rectal bleeding  PROCEDURE:  TRANSANAL HEMORRHOIDAL DEARTERIALIZATION   Surgeon(s): Romie Levee, MD  ASSISTANT: none   ANESTHESIA:   local and general  EBL: 20 ml  Total I/O In: -  Out: 20 [Blood:20]  DRAINS: none   SPECIMEN:  No Specimen  DISPOSITION OF SPECIMEN:  N/A  COUNTS:  YES  PLAN OF CARE: Discharge to home after PACU  PATIENT DISPOSITION:  PACU - hemodynamically stable.  INDICATION: 39 y.o. F with profuse rectal bleeding and grade 3 hemorrhoids.  I recommended dearterialization.   OR FINDINGS: Grade 3 LL and RA internal hemorrhoids  Description: Informed consent was confirmed. Patient underwent general anesthesia without difficulty. Patient was placed into lithotomy positioning.  The perianal region was prepped and draped in sterile fashion. Surgical time out confirmed or plan.  A rectal block was placed with Marcaine mized with epinephrine and Experel.  I did digital rectal examination and then transitioned over to anoscopy to get a sense of the anatomy.  I switched over to the [facility] fiberoptically lit Doppler anocope.   Using the Doppler on the tip of the THD anoscope, I identified the arterial hemorrhoidal vessels coming in in the classic hexagonal anatomical pattern  (right posterior/lateral/anterior, left posterior /lateral/anterior).    I proceeded to ligate the hemorrhoidal arteries. I used a 2-0 Vicryl suture on a UR-6 needle in a figure-of-eight fashion over the signal around 6 cm proximal to the anal verge. I then ran that stitch longitudinally more distally to the dentate line. I then tied that stitch down to cause a hemorrhoidopexy. I did that for all 6 locations.    I redid  Doppler anoscopy. I Identified a signal at the LL and LA location.  I isolated and ligated this with a figure-of-eight stitch. Signals went away.  At completion of this, all hemorrhoids were reduced into the rectum.  There is no more prolapse. External anatomy looked normal.  I repeated anoscopy and examination.   Hemostasis was good.  Patient is being extubated go to recovery room.  I am about to discuss the patient's status to the family.

## 2019-04-12 NOTE — Anesthesia Preprocedure Evaluation (Addendum)
Anesthesia Evaluation  Patient identified by MRN, date of birth, ID band Patient awake    Reviewed: Allergy & Precautions, NPO status , Patient's Chart, lab work & pertinent test results  Airway Mallampati: II  TM Distance: >3 FB Neck ROM: Full   Comment: Plastic piercing in nose and above upper lip Dental no notable dental hx.    Pulmonary neg pulmonary ROS,    Pulmonary exam normal breath sounds clear to auscultation + decreased breath sounds      Cardiovascular hypertension, Normal cardiovascular exam Rhythm:Regular Rate:Normal     Neuro/Psych negative neurological ROS  negative psych ROS   GI/Hepatic negative GI ROS, Neg liver ROS,   Endo/Other  Morbid obesity  Renal/GU negative Renal ROS  negative genitourinary   Musculoskeletal negative musculoskeletal ROS (+)   Abdominal (+) + obese,   Peds negative pediatric ROS (+)  Hematology  (+) anemia ,   Anesthesia Other Findings   Reproductive/Obstetrics negative OB ROS                            Anesthesia Physical Anesthesia Plan  ASA: III  Anesthesia Plan: General   Post-op Pain Management:    Induction: Intravenous  PONV Risk Score and Plan: 3 and Ondansetron, Dexamethasone and Treatment may vary due to age or medical condition  Airway Management Planned: Oral ETT  Additional Equipment:   Intra-op Plan:   Post-operative Plan: Extubation in OR  Informed Consent: I have reviewed the patients History and Physical, chart, labs and discussed the procedure including the risks, benefits and alternatives for the proposed anesthesia with the patient or authorized representative who has indicated his/her understanding and acceptance.     Dental advisory given  Plan Discussed with: CRNA and Surgeon  Anesthesia Plan Comments:         Anesthesia Quick Evaluation

## 2019-04-12 NOTE — Interval H&P Note (Signed)
History and Physical Interval Note:  04/12/2019 12:38 PM  Stacey Bray  has presented today for surgery, with the diagnosis of grade 3 hemorrhoids, rectal bleeding.  The various methods of treatment have been discussed with the patient and family. After consideration of risks, benefits and other options for treatment, the patient has consented to  Procedure(s): TRANSANAL HEMORRHOIDAL DEARTERIALIZATION (N/A) as a surgical intervention.  The patient's history has been reviewed, patient examined, no change in status, stable for surgery.  I have reviewed the patient's chart and labs.  Questions were answered to the patient's satisfaction.     Vanita Panda, MD  Colorectal and General Surgery [facility]

## 2019-04-12 NOTE — Anesthesia Postprocedure Evaluation (Signed)
Anesthesia Post Note  Patient: Stacey Bray  Procedure(s) Performed: TRANSANAL HEMORRHOIDAL DEARTERIALIZATION (N/A Rectum)     Patient location during evaluation: PACU Anesthesia Type: General Level of consciousness: sedated Pain management: pain level controlled Vital Signs Assessment: post-procedure vital signs reviewed and stable Respiratory status: spontaneous breathing and respiratory function stable Cardiovascular status: stable Postop Assessment: no apparent nausea or vomiting Anesthetic complications: no    Last Vitals:  Vitals:   04/12/19 1515 04/12/19 1553  BP: (!) 151/91 134/88  Pulse: 71 75  Resp: 16 20  Temp: 36.7 C 36.8 C  SpO2: 94% 98%    Last Pain:  Vitals:   04/12/19 1553  TempSrc: Oral  PainSc:                  [name] DANIEL

## 2019-04-12 NOTE — Anesthesia Procedure Notes (Signed)
Procedure Name: Intubation Date/Time: 04/12/2019 1:01 PM Performed by: Eben Burow, CRNA Pre-anesthesia Checklist: Patient identified, Emergency Drugs available, Suction available, Patient being monitored and Timeout performed Patient Re-evaluated:Patient Re-evaluated prior to induction Oxygen Delivery Method: Circle system utilized Preoxygenation: Pre-oxygenation with 100% oxygen Induction Type: IV induction Ventilation: Mask ventilation without difficulty Laryngoscope Size: Mac and 4 Grade View: Grade I Tube type: Oral Tube size: 7.0 mm Number of attempts: 1 Airway Equipment and Method: Stylet Placement Confirmation: ETT inserted through vocal cords under direct vision,  positive ETCO2 and breath sounds checked- equal and bilateral Secured at: 22 cm Tube secured with: Tape Dental Injury: Teeth and Oropharynx as per pre-operative assessment

## 2019-04-12 NOTE — Transfer of Care (Signed)
Immediate Anesthesia Transfer of Care Note  Patient: Stacey Bray  Procedure(s) Performed: TRANSANAL HEMORRHOIDAL DEARTERIALIZATION (N/A Rectum)  Patient Location: PACU  Anesthesia Type:General  Level of Consciousness: awake  Airway & Oxygen Therapy: Patient Spontanous Breathing and Patient connected to face mask oxygen  Post-op Assessment: Report given to RN and Post -op Vital signs reviewed and stable  Post vital signs: Reviewed and stable  Last Vitals:  Vitals Value Taken Time  BP 181/146 04/12/19 1406  Temp    Pulse 83 04/12/19 1407  Resp 17 04/12/19 1407  SpO2 100 % 04/12/19 1407  Vitals shown include unvalidated device data.  Last Pain:  Vitals:   04/12/19 1131  TempSrc: Oral      Patients Stated Pain Goal: 5 (04/12/19 1129)  Complications: No apparent anesthesia complications

## 2019-04-12 NOTE — Discharge Instructions (Signed)
ANORECTAL SURGERY: POST OP INSTRUCTIONS °1. Take your usually prescribed home medications unless otherwise directed. °2. DIET: During the first few hours after surgery sip on some liquids until you are able to urinate.  It is normal to not urinate for several hours after this surgery.  If you feel uncomfortable, please contact the office for instructions.  After you are able to urinate,you may eat, if you feel like it.  Follow a light bland diet the first 24 hours after arrival home, such as soup, liquids, crackers, etc.  Be sure to include lots of fluids daily (6-8 glasses).  Avoid fast food or heavy meals, as your are more likely to get nauseated.  Eat a low fat diet the next few days after surgery.  Limit caffeine intake to 1-2 servings a day. °3. PAIN CONTROL: °a. Pain is best controlled by a usual combination of several different methods TOGETHER: °i. Muscle relaxation: Soak in a warm bath (or Sitz bath) three times a day and after bowel movements.  Continue to do this until all pain is resolved. °ii. Over the counter pain medication °iii. Prescription pain medication °b. Most patients will experience some swelling and discomfort in the anus/rectal area and incisions.  Heat such as warm towels, sitz baths, warm baths, etc to help relax tight/sore spots and speed recovery.  Some people prefer to use ice, especially in the first couple days after surgery, as it may decrease the pain and swelling, or alternate between ice & heat.  Experiment to what works for you.  Swelling and bruising can take several weeks to resolve.  Pain can take even longer to completely resolve. °c. It is helpful to take an over-the-counter pain medication regularly for the first few weeks.  Choose one of the following that works best for you: °i. Naproxen (Aleve, etc)  Two 220mg tabs twice a day °ii. Ibuprofen (Advil, etc) Three 200mg tabs four times a day (every meal & bedtime) °d. A  prescription for pain medication (such as percocet,  oxycodone, hydrocodone, etc) should be given to you upon discharge.  Take your pain medication as prescribed.  °i. If you are having problems/concerns with the prescription medicine (does not control pain, nausea, vomiting, rash, itching, etc), please call us [phone] to see if we need to switch you to a different pain medicine that will work better for you and/or control your side effect better. °ii. If you need a refill on your pain medication, please contact your pharmacy.  They will contact our office to request authorization. Prescriptions will not be filled after 5 pm or on week-ends. °4. KEEP YOUR BOWELS REGULAR and AVOID CONSTIPATION °a. The goal is one to two soft bowel movements a day.  You should at least have a bowel movement every other day. °b. Avoid getting constipated.  Between the surgery and the pain medications, it is common to experience some constipation. This can be very painful after rectal surgery.  Increasing fluid intake and taking a fiber supplement (such as Metamucil, Citrucel, FiberCon, etc) 1-2 times a day regularly will usually help prevent this problem from occurring.  A stool softener like colace is also recommended.  This can be purchased over the counter at your pharmacy.  You can take it up to 3 times a day.  If you do not have a bowel movement after 24 hrs since your surgery, take one does of milk of magnesia.  If you still haven't had a bowel movement 8-12 hours after   that dose, take another dose.  If you don't have a bowel movement 48 hrs after surgery, purchase a Fleets enema from the drug store and administer gently per package instructions.  If you still are having trouble with your bowel movements after that, please call the office for further instructions. °c. If you develop diarrhea or have many loose bowel movements, simplify your diet to bland foods & liquids for a few days.  Stop any stool softeners and decrease your fiber supplement.  Switching to mild  anti-diarrheal medications (Kayopectate, Pepto Bismol) can help.  If this worsens or does not improve, please call us. ° °5. Wound Care °a. Remove your bandages before your first bowel movement or 8 hours after surgery.     °b. Remove any wound packing material at this tim,e as well.  You do not need to repack the wound unless instructed otherwise.  Wear an absorbent pad or soft cotton gauze in your underwear to catch any drainage and help keep the area clean. You should change this every 2-3 hours while awake. °c. Keep the area clean and dry.  Bathe / shower every day, especially after bowel movements.  Keep the area clean by showering / bathing over the incision / wound.   It is okay to soak an open wound to help wash it.  Wet wipes or showers / gentle washing after bowel movements is often less traumatic than regular toilet paper. °d. You may have some styrofoam-like soft packing in the rectum which will come out with the first bowel movement.  °e. You will often notice bleeding with bowel movements.  This should slow down by the end of the first week of surgery °f. Expect some drainage.  This should slow down, too, by the end of the first week of surgery.  Wear an absorbent pad or soft cotton gauze in your underwear until the drainage stops. °g. Do Not sit on a rubber or pillow ring.  This can make you symptoms worse.  You may sit on a soft pillow if needed.  °6. ACTIVITIES as tolerated:   °a. You may resume regular (light) daily activities beginning the next day--such as daily self-care, walking, climbing stairs--gradually increasing activities as tolerated.  If you can walk 30 minutes without difficulty, it is safe to try more intense activity such as jogging, treadmill, bicycling, low-impact aerobics, swimming, etc. °b. Save the most intensive and strenuous activity for last such as sit-ups, heavy lifting, contact sports, etc  Refrain from any heavy lifting or straining until you are off narcotics for pain  control.   °c. You may drive when you are no longer taking prescription pain medication, you can comfortably sit for long periods of time, and you can safely maneuver your car and apply brakes. °d. You may have sexual intercourse when it is comfortable.  °7. FOLLOW UP in our office °a. Please call [facility] at [phone] to set up an appointment to see your surgeon in the office for a follow-up appointment approximately 3-4 weeks after your surgery. °b. Make sure that you call for this appointment the day you arrive home to insure a convenient appointment time. °10. IF YOU HAVE DISABILITY OR FAMILY LEAVE FORMS, BRING THEM TO THE OFFICE FOR PROCESSING.  DO NOT GIVE THEM TO YOUR DOCTOR. ° ° ° ° °WHEN TO CALL US [phone]: °1. Poor pain control °2. Reactions / problems with new medications (rash/itching, nausea, etc)  °3. Fever over 101.5 F (38.5 C) °4.   Inability to urinate °5. Nausea and/or vomiting °6. Worsening swelling or bruising °7. Continued bleeding from incision. °8. Increased pain, redness, or drainage from the incision ° °The clinic staff is available to answer your questions during regular business hours (8:30am-5pm).  Please don’t hesitate to call and ask to speak to one of our nurses for clinical concerns.   A surgeon from [facility] is always on call at the hospitals °  °If you have a medical emergency, go to the nearest emergency room or call 911. °  ° °[facility] °[address], [address], [place], [place]  [address] ? °MAIN: [phone] ? TOLL FREE: [phone] ? °FAX [phone] °[email or web address] ° ° ° °

## 2019-06-05 ENCOUNTER — Encounter (HOSPITAL_COMMUNITY): Payer: Self-pay | Admitting: Emergency Medicine

## 2019-06-05 ENCOUNTER — Emergency Department (HOSPITAL_COMMUNITY)
Admission: EM | Admit: 2019-06-05 | Discharge: 2019-06-05 | Disposition: A | Discharge status: Home / Self Care | Payer: Medicaid Other | Attending: Emergency Medicine | Admitting: Emergency Medicine

## 2019-06-05 ENCOUNTER — Other Ambulatory Visit: Payer: Self-pay

## 2019-06-05 DIAGNOSIS — M5441 Lumbago with sciatica, right side: Secondary | ICD-10-CM | POA: Insufficient documentation

## 2019-06-05 DIAGNOSIS — M5440 Lumbago with sciatica, unspecified side: Secondary | ICD-10-CM

## 2019-06-05 DIAGNOSIS — M545 Low back pain: Secondary | ICD-10-CM | POA: Diagnosis present

## 2019-06-05 DIAGNOSIS — M5442 Lumbago with sciatica, left side: Secondary | ICD-10-CM | POA: Insufficient documentation

## 2019-06-05 LAB — URINALYSIS, ROUTINE W REFLEX MICROSCOPIC
Bilirubin Urine: NEGATIVE
Glucose, UA: NEGATIVE mg/dL
Hgb urine dipstick: NEGATIVE
Ketones, ur: NEGATIVE mg/dL
Leukocytes,Ua: NEGATIVE
Nitrite: NEGATIVE
Protein, ur: NEGATIVE mg/dL
Specific Gravity, Urine: 1.018 (ref 1.005–1.030)
pH: 6 (ref 5.0–8.0)

## 2019-06-05 LAB — BASIC METABOLIC PANEL
Anion gap: 13 (ref 5–15)
BUN: 5 mg/dL — ABNORMAL LOW (ref 6–20)
CO2: 26 mmol/L (ref 22–32)
Calcium: 9.1 mg/dL (ref 8.9–10.3)
Chloride: 101 mmol/L (ref 98–111)
Creatinine, Ser: 0.91 mg/dL (ref 0.44–1.00)
GFR calc Af Amer: >60 mL/min (ref >60)
GFR calc non Af Amer: >60 mL/min (ref >60)
Glucose, Bld: 115 mg/dL — ABNORMAL HIGH (ref 70–99)
Potassium: 3.8 mmol/L (ref 3.5–5.1)
Sodium: 140 mmol/L (ref 135–145)

## 2019-06-05 LAB — CBC WITH DIFFERENTIAL/PLATELET
Abs Immature Granulocytes: 0.03 10*3/uL (ref 0.00–0.07)
Basophils Absolute: 0 10*3/uL (ref 0.0–0.1)
Basophils Relative: 1 %
Eosinophils Absolute: 0.1 10*3/uL (ref 0.0–0.5)
Eosinophils Relative: 1 %
HCT: 35.4 % — ABNORMAL LOW (ref 36.0–46.0)
Hemoglobin: 10.9 g/dL — ABNORMAL LOW (ref 12.0–15.0)
Immature Granulocytes: 0 %
Lymphocytes Relative: 24 %
Lymphs Abs: 2 10*3/uL (ref 0.7–4.0)
MCH: 24.1 pg — ABNORMAL LOW (ref 26.0–34.0)
MCHC: 30.8 g/dL (ref 30.0–36.0)
MCV: 78.3 fL — ABNORMAL LOW (ref 80.0–100.0)
Monocytes Absolute: 0.5 10*3/uL (ref 0.1–1.0)
Monocytes Relative: 6 %
Neutro Abs: 5.7 10*3/uL (ref 1.7–7.7)
Neutrophils Relative %: 68 %
Platelets: 285 10*3/uL (ref 150–400)
RBC: 4.52 MIL/uL (ref 3.87–5.11)
RDW: 16.8 % — ABNORMAL HIGH (ref 11.5–15.5)
WBC: 8.4 10*3/uL (ref 4.0–10.5)
nRBC: 0 % (ref 0.0–0.2)

## 2019-06-05 LAB — I-STAT BETA HCG BLOOD, ED (MC, WL, AP ONLY): I-stat hCG, quantitative: <5 m[IU]/mL (ref <5)

## 2019-06-05 MED ORDER — OXYCODONE-ACETAMINOPHEN 5-325 MG PO TABS
1.0000 | ORAL_TABLET | Freq: Once | ORAL | Status: AC
  Administered 2019-06-05: 1 via ORAL
  Filled 2019-06-05: qty 1

## 2019-06-05 MED ORDER — METHOCARBAMOL 500 MG PO TABS: 500.0000 mg | ORAL_TABLET | Freq: Three times a day (TID) | ORAL | 0 refills | Status: DC | PRN

## 2019-06-05 MED ORDER — NAPROXEN 250 MG PO TABS
500.0000 mg | ORAL_TABLET | Freq: Once | ORAL | Status: AC
  Administered 2019-06-05: 500 mg via ORAL
  Filled 2019-06-05: qty 2

## 2019-06-05 MED ORDER — ACETAMINOPHEN 500 MG PO TABS
1000.0000 mg | ORAL_TABLET | Freq: Once | ORAL | Status: AC
  Administered 2019-06-05: 1000 mg via ORAL
  Filled 2019-06-05: qty 2

## 2019-06-05 MED ORDER — METHOCARBAMOL 500 MG PO TABS
1000.0000 mg | ORAL_TABLET | Freq: Once | ORAL | Status: AC
  Administered 2019-06-05: 1000 mg via ORAL
  Filled 2019-06-05: qty 2

## 2019-06-05 MED ORDER — NAPROXEN 500 MG PO TABS: 500.0000 mg | ORAL_TABLET | Freq: Two times a day (BID) | ORAL | 0 refills | Status: DC

## 2019-06-05 NOTE — Discharge Instructions (Signed)
You were evaluated in the Emergency Department and after careful evaluation, we did not find any emergent condition requiring admission or further testing in the hospital.  Your exam/testing today was overall reassuring.  Please take the Naprosyn anti-inflammatory medication as directed.  For more significant pain keeping you from sleeping at night, you can use the Robaxin muscle relaxer.  Please return to the Emergency Department if you experience any worsening of your condition.  We encourage you to follow up with a primary care provider.  Thank you for allowing [place] to be a part of your care.

## 2019-06-05 NOTE — ED Provider Notes (Signed)
[facility] Emergency Department Provider Note MRN:  [id number]  Arrival date & time: 06/05/19     Chief Complaint   Back Pain   History of Present Illness   Stacey Bray is a 39 y.o. year-old female with a history of chronic back pain, hypertension presenting to the ED with chief complaint of back pain.  2 weeks of gradual onset back pain, located in the lower back bilaterally, worse with certain positions or movements.  Denies numbness or weakness to the arms or legs, no bowel or bladder dysfunction, no fever, no IV drug use, no chest pain or shortness of breath, no abdominal pain, no dysuria, no hematuria.  Pain is moderate, constant.  Review of Systems  A complete 10 system review of systems was obtained and all systems are negative except as noted in the HPI and PMH.   Patient's Health History    Past Medical History:  Diagnosis Date  . Anemia   . Chronic back pain   . Chronic neck pain   . Hypertension   . Sleep apnea     Past Surgical History:  Procedure Laterality Date  . ABDOMINAL SURGERY    . BACK SURGERY     cervical spine  . COLONOSCOPY WITH PROPOFOL N/A 03/13/2019   Procedure: COLONOSCOPY WITH PROPOFOL;  Surgeon: Tressia Danas, MD;  Location: [facility];  Service: Gastroenterology;  Laterality: N/A;  . ESOPHAGOGASTRODUODENOSCOPY (EGD) WITH PROPOFOL N/A 03/13/2019   Procedure: ESOPHAGOGASTRODUODENOSCOPY (EGD) WITH PROPOFOL;  Surgeon: Tressia Danas, MD;  Location: [facility];  Service: Gastroenterology;  Laterality: N/A;  . LAPAROSCOPIC GASTRIC SLEEVE RESECTION  2016  . POLYPECTOMY  03/13/2019   Procedure: POLYPECTOMY;  Surgeon: Tressia Danas, MD;  Location: [facility];  Service: Gastroenterology;;  . TRANSANAL HEMORRHOIDAL DEARTERIALIZATION N/A 04/12/2019   Procedure: TRANSANAL HEMORRHOIDAL DEARTERIALIZATION;  Surgeon: Romie Levee, MD;  Location: [facility];  Service: General;  Laterality: N/A;    Family History  Problem  Relation Age of Onset  . Colon cancer Neg Hx   . Esophageal cancer Neg Hx   . Pancreatic cancer Neg Hx   . Stomach cancer Neg Hx   . Liver disease Neg Hx     Social History   Socioeconomic History  . Marital status: Married    Spouse name: Not on file  . Number of children: Not on file  . Years of education: Not on file  . Highest education level: Not on file  Occupational History  . Not on file  Tobacco Use  . Smoking status: Never Smoker  . Smokeless tobacco: Never Used  Substance and Sexual Activity  . Alcohol use: No  . Drug use: No  . Sexual activity: Yes    Birth control/protection: None  Other Topics Concern  . Not on file  Social History Narrative  . Not on file   Social Determinants of Health   Financial Resource Strain:   . Difficulty of Paying Living Expenses:   Food Insecurity:   . Worried About Programme researcher, broadcasting/film/video in the Last Year:   . Barista in the Last Year:   Transportation Needs:   . Freight forwarder (Medical):   Marland Kitchen Lack of Transportation (Non-Medical):   Physical Activity:   . Days of Exercise per Week:   . Minutes of Exercise per Session:   Stress:   . Feeling of Stress :   Social Connections:   . Frequency of Communication with Friends and Family:   .  Frequency of Social Gatherings with Friends and Family:   . Attends Religious Services:   . Active Member of Clubs or Organizations:   . Attends Banker Meetings:   Marland Kitchen Marital Status:   Intimate Partner Violence:   . Fear of Current or Ex-Partner:   . Emotionally Abused:   Marland Kitchen Physically Abused:   . Sexually Abused:      Physical Exam   Vitals:   06/05/19 0747 06/05/19 0812  BP: 132/79 136/79  Pulse: 97 64  Resp: 17 16  Temp:  97.8 F (36.6 C)  SpO2: 99% 98%    CONSTITUTIONAL: Well-appearing, NAD NEURO:  Alert and oriented x 3, normal and symmetric strength and sensation of the legs EYES:  eyes equal and reactive ENT/NECK:  no LAD, no JVD CARDIO:  Regular rate, well-perfused, normal S1 and S2 PULM:  CTAB no wheezing or rhonchi GI/GU:  normal bowel sounds, non-distended, non-tender MSK/SPINE: Mild tenderness to palpation to the musculature of the lower back bilaterally SKIN:  no rash, atraumatic PSYCH:  Appropriate speech and behavior  *Additional and/or pertinent findings included in MDM below  Diagnostic and Interventional Summary    EKG Interpretation  Date/Time:    Ventricular Rate:    PR Interval:    QRS Duration:   QT Interval:    QTC Calculation:   R Axis:     Text Interpretation:        Labs Reviewed  CBC WITH DIFFERENTIAL/PLATELET - Abnormal; Notable for the following components:      Result Value   Hemoglobin 10.9 (*)    HCT 35.4 (*)    MCV 78.3 (*)    MCH 24.1 (*)    RDW 16.8 (*)    All other components within normal limits  BASIC METABOLIC PANEL - Abnormal; Notable for the following components:   Glucose, Bld 115 (*)    BUN 5 (*)    All other components within normal limits  URINALYSIS, ROUTINE W REFLEX MICROSCOPIC  I-STAT BETA HCG BLOOD, ED ([facility], [facility], [facility] ONLY)    No orders to display    Medications  oxyCODONE-acetaminophen (PERCOCET/ROXICET) 5-325 MG per tablet 1 tablet (1 tablet Oral Given 06/05/19 0534)  acetaminophen (TYLENOL) tablet 1,000 mg (1,000 mg Oral Given 06/05/19 0746)  methocarbamol (ROBAXIN) tablet 1,000 mg (1,000 mg Oral Given 06/05/19 0747)  naproxen (NAPROSYN) tablet 500 mg (500 mg Oral Given 06/05/19 0747)     Procedures  /  Critical Care Procedures  ED Course and Medical Decision Making  I have reviewed the triage vital signs, the nursing notes, and pertinent available records from the EMR.  Listed above are laboratory and imaging tests that I personally ordered, reviewed, and interpreted and then considered in my medical decision making (see below for details).      Low back pain with no trauma, normal neurological exam no deficits, no bowel or bladder dysfunction, no fever, in  general no red flags to suggest myelopathy or acute surgical process.  Will attempt further pain control and reassess, anticipating discharge.  Patient is feeling better after medications listed above, appropriate for discharge.  Elmer Sow. Pilar Plate, MD [facility] Emergency Medicine [facility] [email or web address]  Final Clinical Impressions(s) / ED Diagnoses     ICD-10-CM   1. Acute bilateral low back pain with sciatica, sciatica laterality unspecified  M54.40     ED Discharge Orders         Ordered    methocarbamol (ROBAXIN) 500 MG tablet  Every 8 hours PRN     06/05/19 0840    naproxen (NAPROSYN) 500 MG tablet  2 times daily     06/05/19 0840           Discharge Instructions Discussed with and Provided to Patient:     Discharge Instructions     You were evaluated in the Emergency Department and after careful evaluation, we did not find any emergent condition requiring admission or further testing in the hospital.  Your exam/testing today was overall reassuring.  Please take the Naprosyn anti-inflammatory medication as directed.  For more significant pain keeping you from sleeping at night, you can use the Robaxin muscle relaxer.  Please return to the Emergency Department if you experience any worsening of your condition.  We encourage you to follow up with a primary care provider.  Thank you for allowing [place] to be a part of your care.        Maudie Flakes, MD 06/05/19 [phone]

## 2019-06-05 NOTE — ED Triage Notes (Signed)
Patient reports low back pain for 2 weeks , denies injury , no dysuria or hematuria .

## 2019-07-05 ENCOUNTER — Other Ambulatory Visit: Payer: Self-pay | Admitting: Adult Health Nurse Practitioner

## 2019-07-05 ENCOUNTER — Other Ambulatory Visit: Payer: Self-pay

## 2019-07-05 ENCOUNTER — Ambulatory Visit
Admission: RE | Admit: 2019-07-05 | Discharge: 2019-07-05 | Disposition: A | Discharge status: Home / Self Care | Payer: Medicaid Other | Source: Ambulatory Visit | Attending: Adult Health Nurse Practitioner | Admitting: Adult Health Nurse Practitioner

## 2019-07-05 DIAGNOSIS — Z9889 Other specified postprocedural states: Secondary | ICD-10-CM

## 2019-07-05 DIAGNOSIS — M545 Low back pain, unspecified: Secondary | ICD-10-CM

## 2019-07-05 DIAGNOSIS — G8929 Other chronic pain: Secondary | ICD-10-CM

## 2020-06-10 ENCOUNTER — Other Ambulatory Visit: Payer: Self-pay

## 2020-06-10 ENCOUNTER — Emergency Department (HOSPITAL_COMMUNITY)
Admission: EM | Admit: 2020-06-10 | Discharge: 2020-06-10 | Disposition: A | Discharge status: Home / Self Care | Payer: BC Managed Care – PPO | Attending: Emergency Medicine | Admitting: Emergency Medicine

## 2020-06-10 DIAGNOSIS — Z79899 Other long term (current) drug therapy: Secondary | ICD-10-CM | POA: Insufficient documentation

## 2020-06-10 DIAGNOSIS — Z91040 Latex allergy status: Secondary | ICD-10-CM | POA: Insufficient documentation

## 2020-06-10 DIAGNOSIS — R21 Rash and other nonspecific skin eruption: Secondary | ICD-10-CM | POA: Insufficient documentation

## 2020-06-10 DIAGNOSIS — I10 Essential (primary) hypertension: Secondary | ICD-10-CM | POA: Insufficient documentation

## 2020-06-10 LAB — HIV ANTIBODY (ROUTINE TESTING W REFLEX): HIV Screen 4th Generation wRfx: NONREACTIVE

## 2020-06-10 MED ORDER — DOXYCYCLINE HYCLATE 100 MG PO CAPS
100.0000 mg | ORAL_CAPSULE | Freq: Two times a day (BID) | ORAL | 0 refills | Status: DC
Start: 2020-06-10 — End: 2023-09-29

## 2020-06-10 MED ORDER — TRIAMCINOLONE ACETONIDE 0.1 % EX CREA
1.0000 | TOPICAL_CREAM | Freq: Two times a day (BID) | CUTANEOUS | 0 refills | Status: DC
Start: 2020-06-10 — End: 2023-09-29

## 2020-06-10 NOTE — ED Notes (Signed)
Called pt with no answer.

## 2020-06-10 NOTE — ED Provider Notes (Signed)
[facility] EMERGENCY DEPARTMENT Provider Note   CSN: [id number] Arrival date & time: 06/10/20  1027     History Chief Complaint  Patient presents with  . Rash    Stacey Bray is a 40 y.o. female presenting for evaluation of rash.  Patient states over the past week, she has noted several itchy and painful bumps.  The began as a small red area, and then developed a vesicle.  This then pops, and the lesions resolved.  They continue to develop, 1 at a time.  No one else around her has similar symptoms.  She denies spending time outside recently.  No recent travel.  She denies recent fevers, cough, congestion, generalized weakness, malaise, headaches, sore throat.  No nausea, vomiting, Donnell pain, urinary symptoms, abnormal bowel movements.  No new medications.   Additional history and chart review.  Reviewed urgent care note.  Previous provider had discussed with state health epidemiologist who recommended molecular testing for VZV, HSV, syphilis. Felt this was low risk for monkeypox   HPI     Past Medical History:  Diagnosis Date  . Anemia   . Chronic back pain   . Chronic neck pain   . Hypertension   . Sleep apnea     Patient Active Problem List   Diagnosis Date Noted  . Polyp of sigmoid colon   . Iron deficiency anemia   . 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 Grade II hemorrhoids 05/04/2017  . Depression, major, recurrent, moderate (HCC) 03/14/2017  . 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 Sebaceous cyst 10/30/2014  . Sleep apnea 04/19/2014  . Essential hypertension 11/22/2013  . Hidradenitis axillaris 11/22/2013  . Prediabetes 11/22/2013    Past Surgical History:  Procedure Laterality Date  . ABDOMINAL SURGERY    . BACK SURGERY     cervical spine  . COLONOSCOPY WITH PROPOFOL N/A 03/13/2019   Procedure: COLONOSCOPY WITH PROPOFOL;  Surgeon: Tressia Danas, MD;  Location: [facility];  Service: Gastroenterology;   Laterality: N/A;  . ESOPHAGOGASTRODUODENOSCOPY (EGD) WITH PROPOFOL N/A 03/13/2019   Procedure: ESOPHAGOGASTRODUODENOSCOPY (EGD) WITH PROPOFOL;  Surgeon: Tressia Danas, MD;  Location: [facility];  Service: Gastroenterology;  Laterality: N/A;  . LAPAROSCOPIC GASTRIC SLEEVE RESECTION  2016  . POLYPECTOMY  03/13/2019   Procedure: POLYPECTOMY;  Surgeon: Tressia Danas, MD;  Location: [facility];  Service: Gastroenterology;;  . TRANSANAL HEMORRHOIDAL DEARTERIALIZATION N/A 04/12/2019   Procedure: TRANSANAL HEMORRHOIDAL DEARTERIALIZATION;  Surgeon: Romie Levee, MD;  Location: [facility];  Service: General;  Laterality: N/A;     OB History   No obstetric history on file.     Family History  Problem Relation Age of Onset  . Colon cancer Neg Hx   . Esophageal cancer Neg Hx   . Pancreatic cancer Neg Hx   . Stomach cancer Neg Hx   . Liver disease Neg Hx     Social History   Tobacco Use  . Smoking status: Never Smoker  . Smokeless tobacco: Never Used  Vaping Use  . Vaping Use: Never used  Substance Use Topics  . Alcohol use: No  . Drug use: No    Home Medications Prior to Admission medications   Medication Sig Start Date End Date Taking? Authorizing Provider  doxycycline (VIBRAMYCIN) 100 MG capsule Take 1 capsule (100 mg total) by mouth 2 (two) times daily. 06/10/20  Yes Rolena Knutson, PA-C  triamcinolone cream (KENALOG) 0.1 % Apply 1 application topically 2 (two) times daily. 06/10/20  Yes Nicoletta Hush,  PA-C  acetaminophen (TYLENOL) 500 MG tablet Take 1 tablet (500 mg total) by mouth every 6 (six) hours as needed. Patient taking differently: Take 1,000 mg by mouth every 6 (six) hours as needed for mild pain or headache.  12/18/16   Fayrene Helper, PA-C  amLODipine (NORVASC) 10 MG tablet Take 10 mg by mouth daily. 03/14/17   [provider]  bisoprolol-hydrochlorothiazide (ZIAC) 5-6.25 MG tablet Take 1 tablet by mouth daily. 10/25/18   [provider]  buPROPion  (WELLBUTRIN) 75 MG tablet Take 75 mg by mouth in the morning and at bedtime. 03/14/19   [provider]  busPIRone (BUSPAR) 10 MG tablet Take 10 mg by mouth 2 (two) times daily.  10/25/18   [provider]  Calcium Carb-Cholecalciferol (CALCIUM + D3 PO) Take 600 mg by mouth 2 (two) times daily.     [provider]  Cholecalciferol (VITAMIN D-3) 1000 units CAPS Take 2,000 Units by mouth in the morning and at bedtime.     [provider]  Clindamycin-Benzoyl Per, Refr, gel Apply 1 application topically in the morning and at bedtime. 12/26/18   [provider]  diphenhydrAMINE (BENADRYL) 25 MG tablet Take 50 mg by mouth once as needed for allergies (for allergic reactions).     [provider]  docusate sodium (COLACE) 250 MG capsule Take 250 mg by mouth daily.    [provider]  ferrous sulfate 325 (65 FE) MG tablet Take 325 mg by mouth 2 (two) times daily with a meal.    [provider]  hydrocortisone (ANUSOL-HC) 2.5 % rectal cream Place 1 application rectally 2 (two) times daily. Patient taking differently: Place 1 application rectally daily as needed for hemorrhoids.  01/23/19   Tressia Danas, MD  methocarbamol (ROBAXIN) 500 MG tablet Take 1 tablet (500 mg total) by mouth every 8 (eight) hours as needed for muscle spasms. 06/05/19   Sabas Sous, MD  Multiple Vitamins-Calcium (ONE-A-DAY WOMENS PO) Take 1 tablet by mouth daily. Centrum    [provider]  naproxen (NAPROSYN) 500 MG tablet Take 1 tablet (500 mg total) by mouth 2 (two) times daily. 06/05/19   Sabas Sous, MD  oxyCODONE (OXY IR/ROXICODONE) 5 MG immediate release tablet Take 1-2 tablets (5-10 mg total) by mouth every 6 (six) hours as needed. Patient not taking: Reported on 06/05/2019 04/12/19   Romie Levee, MD  psyllium (METAMUCIL) 58.6 % powder Take 1 packet by mouth in the morning and at bedtime.    [provider]    Allergies    Latex,  Lisinopril, and Levofloxacin  Review of Systems   Review of Systems  Skin: Positive for rash.  All other systems reviewed and are negative.   Physical Exam Updated Vital Signs BP 132/88 (BP Location: Left Arm)   Pulse 65   Temp 98.1 F (36.7 C) (Oral)   Resp 17   SpO2 100%   Physical Exam Vitals and nursing note reviewed.  Constitutional:      General: She is not in acute distress.    Appearance: She is well-developed. She is obese.     Comments: Resting in the bed in NAD  HENT:     Head: Normocephalic and atraumatic.  Eyes:     Extraocular Movements: Extraocular movements intact.     Conjunctiva/sclera: Conjunctivae normal.     Pupils: Pupils are equal, round, and reactive to light.  Cardiovascular:     Rate and Rhythm: Normal rate and regular  rhythm.     Pulses: Normal pulses.  Pulmonary:     Effort: Pulmonary effort is normal. No respiratory distress.     Breath sounds: Normal breath sounds. No wheezing.  Abdominal:     General: There is no distension.     Palpations: Abdomen is soft. There is no mass.     Tenderness: There is no abdominal tenderness. There is no guarding or rebound.  Musculoskeletal:        General: Normal range of motion.     Cervical back: Normal range of motion and neck supple.  Skin:    General: Skin is warm and dry.     Capillary Refill: Capillary refill takes less than 2 seconds.     Findings: Rash present.     Comments: See pictures below.  Newest lesion has erythema and itching, but no vesicle.  Older lesions have a central pustule with surrounding inflammation.  Patient has approximately 10 lesions on her body, sparing the face, hands, soles.  No purulent drainage.  Neurological:     Mental Status: She is alert and oriented to person, place, and time.     Initial presentation of lesion      Development of lesion    ED Results / Procedures / Treatments   Labs (all labs ordered are listed, but only abnormal results are  displayed) Labs Reviewed  HSV CULTURE AND TYPING  VARICELLA-ZOSTER BY PCR  RPR  HIV ANTIBODY (ROUTINE TESTING W REFLEX)    EKG None  Radiology No results found.  Procedures Procedures   Medications Ordered in ED Medications - No data to display  ED Course  I have reviewed the triage vital signs and the nursing notes.  Pertinent labs & imaging results that were available during my care of the patient were reviewed by me and considered in my medical decision making (see chart for details).    MDM Rules/Calculators/A&P                          Patient presenting for evaluation of rash.  Is both itchy and painful.  On exam, patient appears nontoxic.  She has no prodromal features, and this is not consistent with monkey box.  Most consistent with insect bites (?ant), as lesions are very discrete.  However as there was recommendation for testing for VZV, HSV, syphilis, these tests were ordered.  Discussed symptomatic treatment with patient, follow-up with PCP/dermatology as needed.  At this time, patient appears safe for discharge.  Return precautions given.  Patient states she understands and agrees to plan.  Final Clinical Impression(s) / ED Diagnoses Final diagnoses:  Rash and nonspecific skin eruption    Rx / DC Orders ED Discharge Orders         Ordered    triamcinolone cream (KENALOG) 0.1 %  2 times daily        06/10/20 1824    doxycycline (VIBRAMYCIN) 100 MG capsule  2 times daily        06/10/20 1824           Alveria Apley, PA-C 06/10/20 1837    Milagros Loll, MD 06/10/20 2039

## 2020-06-10 NOTE — ED Provider Notes (Signed)
Emergency Medicine Provider Triage Evaluation Note  Stacey Bray , a 40 y.o. female  was evaluated in triage.  Pt complains of rash x 1 week. Says painful bumps appeared about a weak ago to calves bilaterally, left arm, and left hip. No fevers, chills, drainage. Was on ABX weeks ago. Not diabetic or immunocompromised to her knowledge  Review of Systems  Positive: Abscess  Negative: Fevers, chills, nausea, vomiting   Physical Exam  BP (!) 140/110 (BP Location: Left Arm)   Pulse 69   Temp 98.1 F (36.7 C) (Oral)   Resp 17   SpO2 96%  Gen:   Awake, no distress   Resp:  Normal effort  MSK:   Moves extremities without difficulty  Other:  Multiple abscesses   Medical Decision Making  Medically screening exam initiated at 11:34 AM.  Appropriate orders placed.  Sheilah Pigeon was informed that the remainder of the evaluation will be completed by another provider, this initial triage assessment does not replace that evaluation, and the importance of remaining in the ED until their evaluation is complete.     Theron Arista, PA-C 06/10/20 1136    Arby Barrette, MD 06/17/20 2110

## 2020-06-10 NOTE — ED Triage Notes (Signed)
Pt sent from [facility] for "evaluation of molecular testing to rule out HSV, syphilis, VZV per the state department epidemiologist." Endorses rash/boils on legs and arms.

## 2020-06-10 NOTE — Discharge Instructions (Signed)
Use the cream to help with itching.  Do not apply on your face. Take the doxycycline to help with any potential infection.  Take the entire course even if symptoms improve. If you are having lots of itching, use ice or a cool compress instead of scratching. Follow-up with either your primary care doctor or the dermatologist listed below as needed if your symptoms persist. I recommend washing all bedding, towels, blankets.  Return to the emergency room if you develop high fevers, confusion, chest pain, difficulty breathing, generalized weakness, or any new, worsening, or concerning symptoms.

## 2020-06-11 LAB — RPR: RPR Ser Ql: NONREACTIVE

## 2022-02-03 IMAGING — CR DG LUMBAR SPINE 2-3V
3 series · 3 of 3 positions shown · non-contrast
Comparison: October 10, 2012

CLINICAL DATA: Low back pain

EXAM:
LUMBAR SPINE - 2-3 VIEW

[t lumbar spine ap]
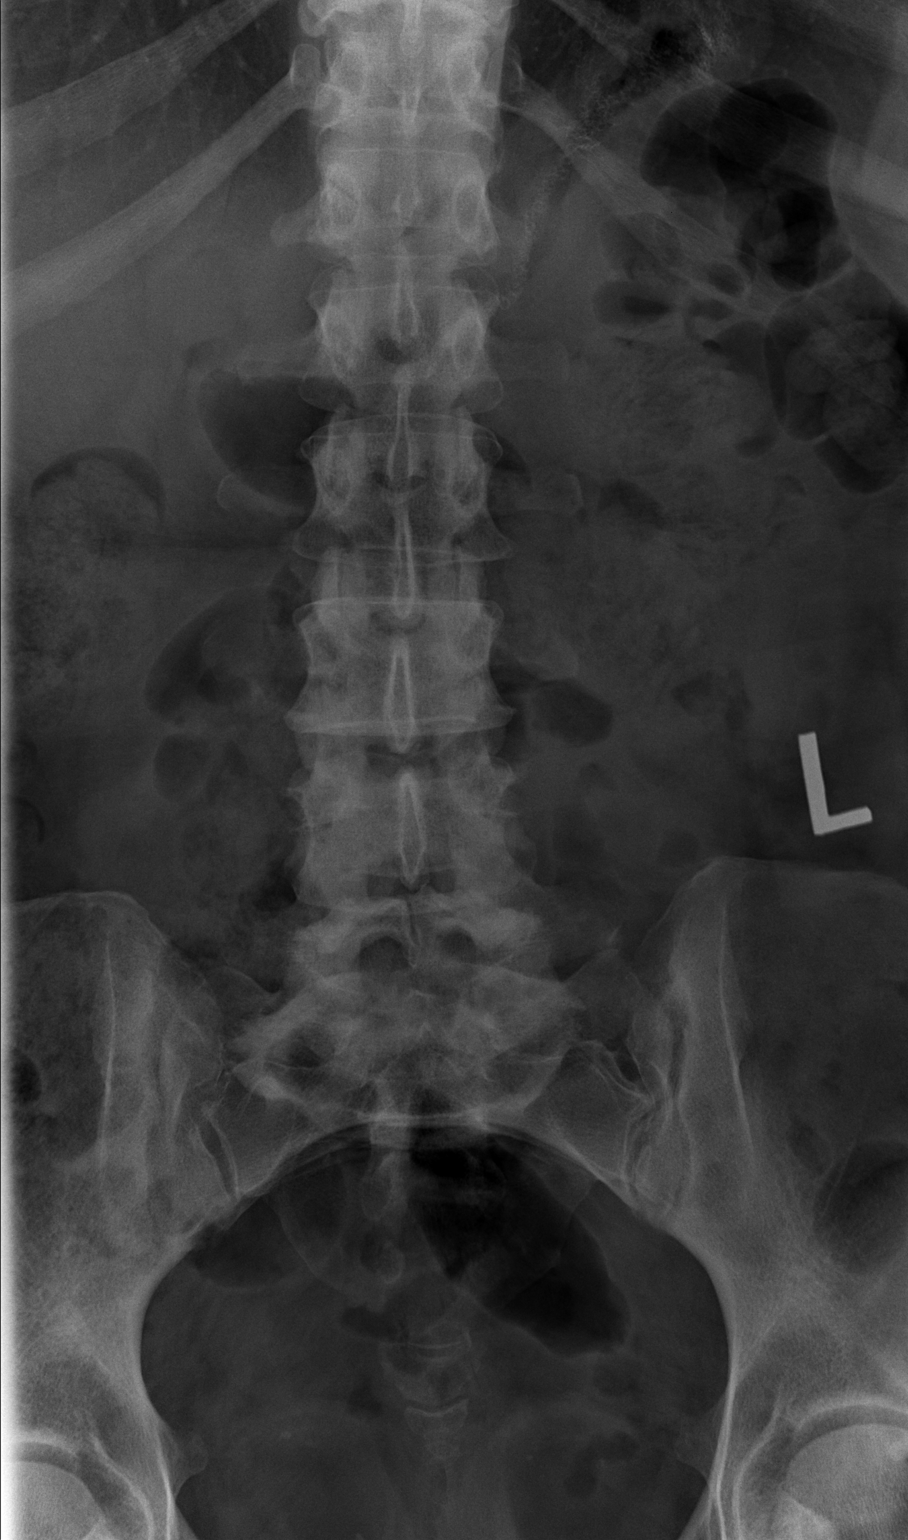

[t lumbar spine lat]
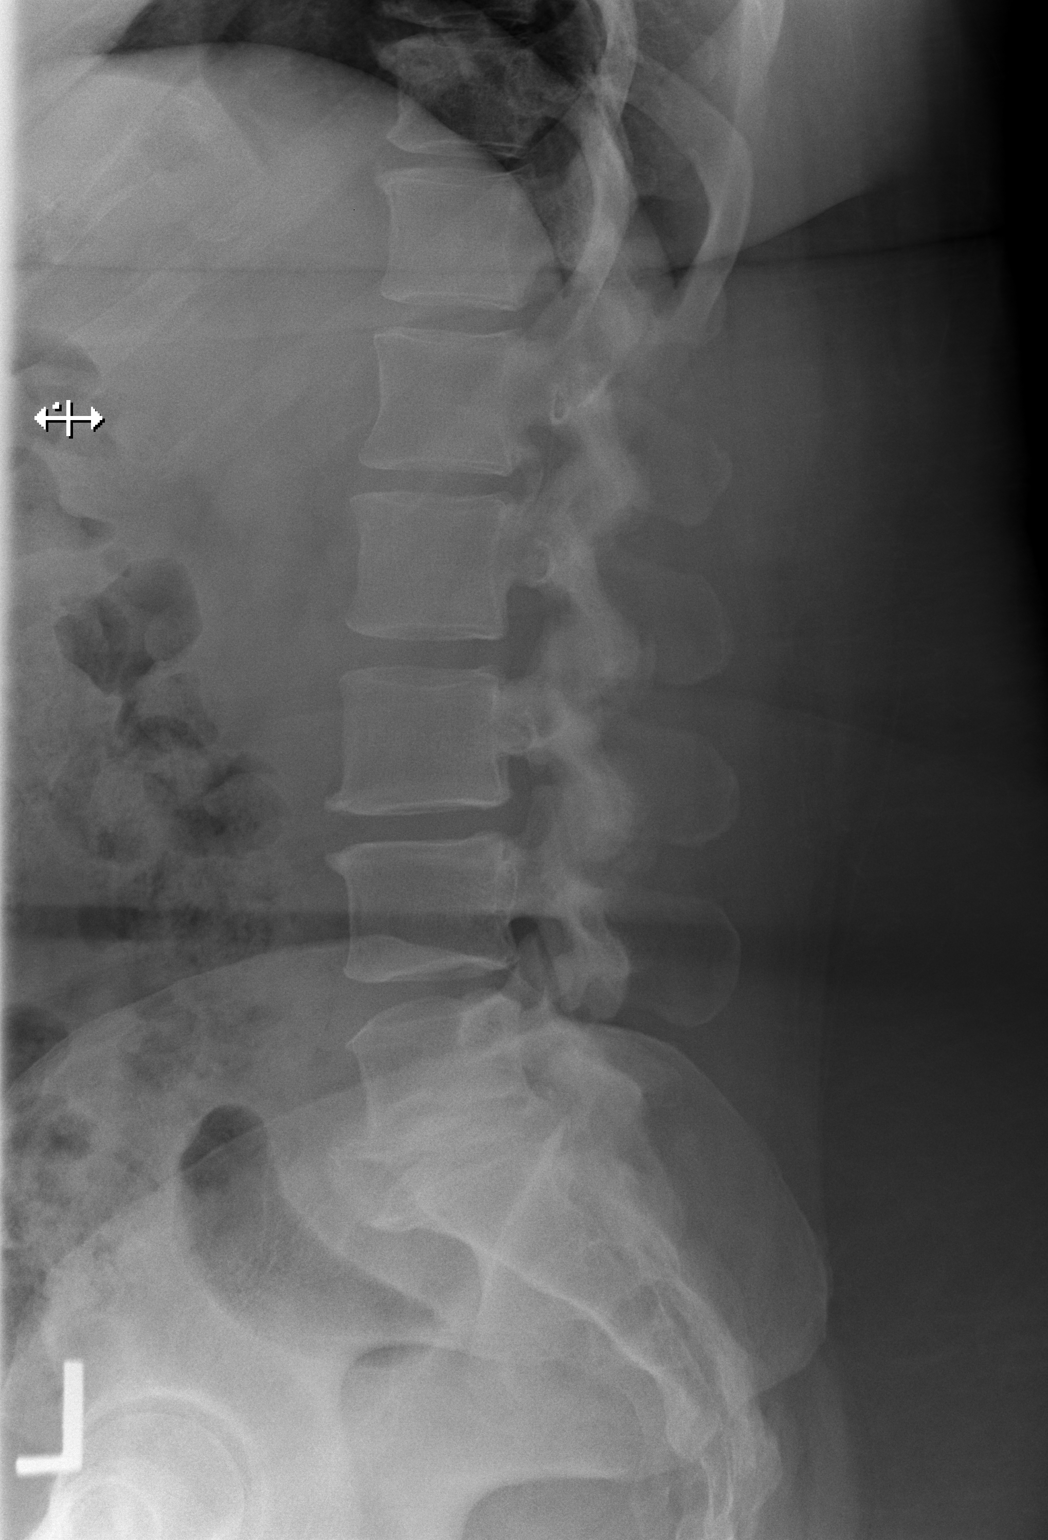

[t lumbar l-5 s-1 spot]
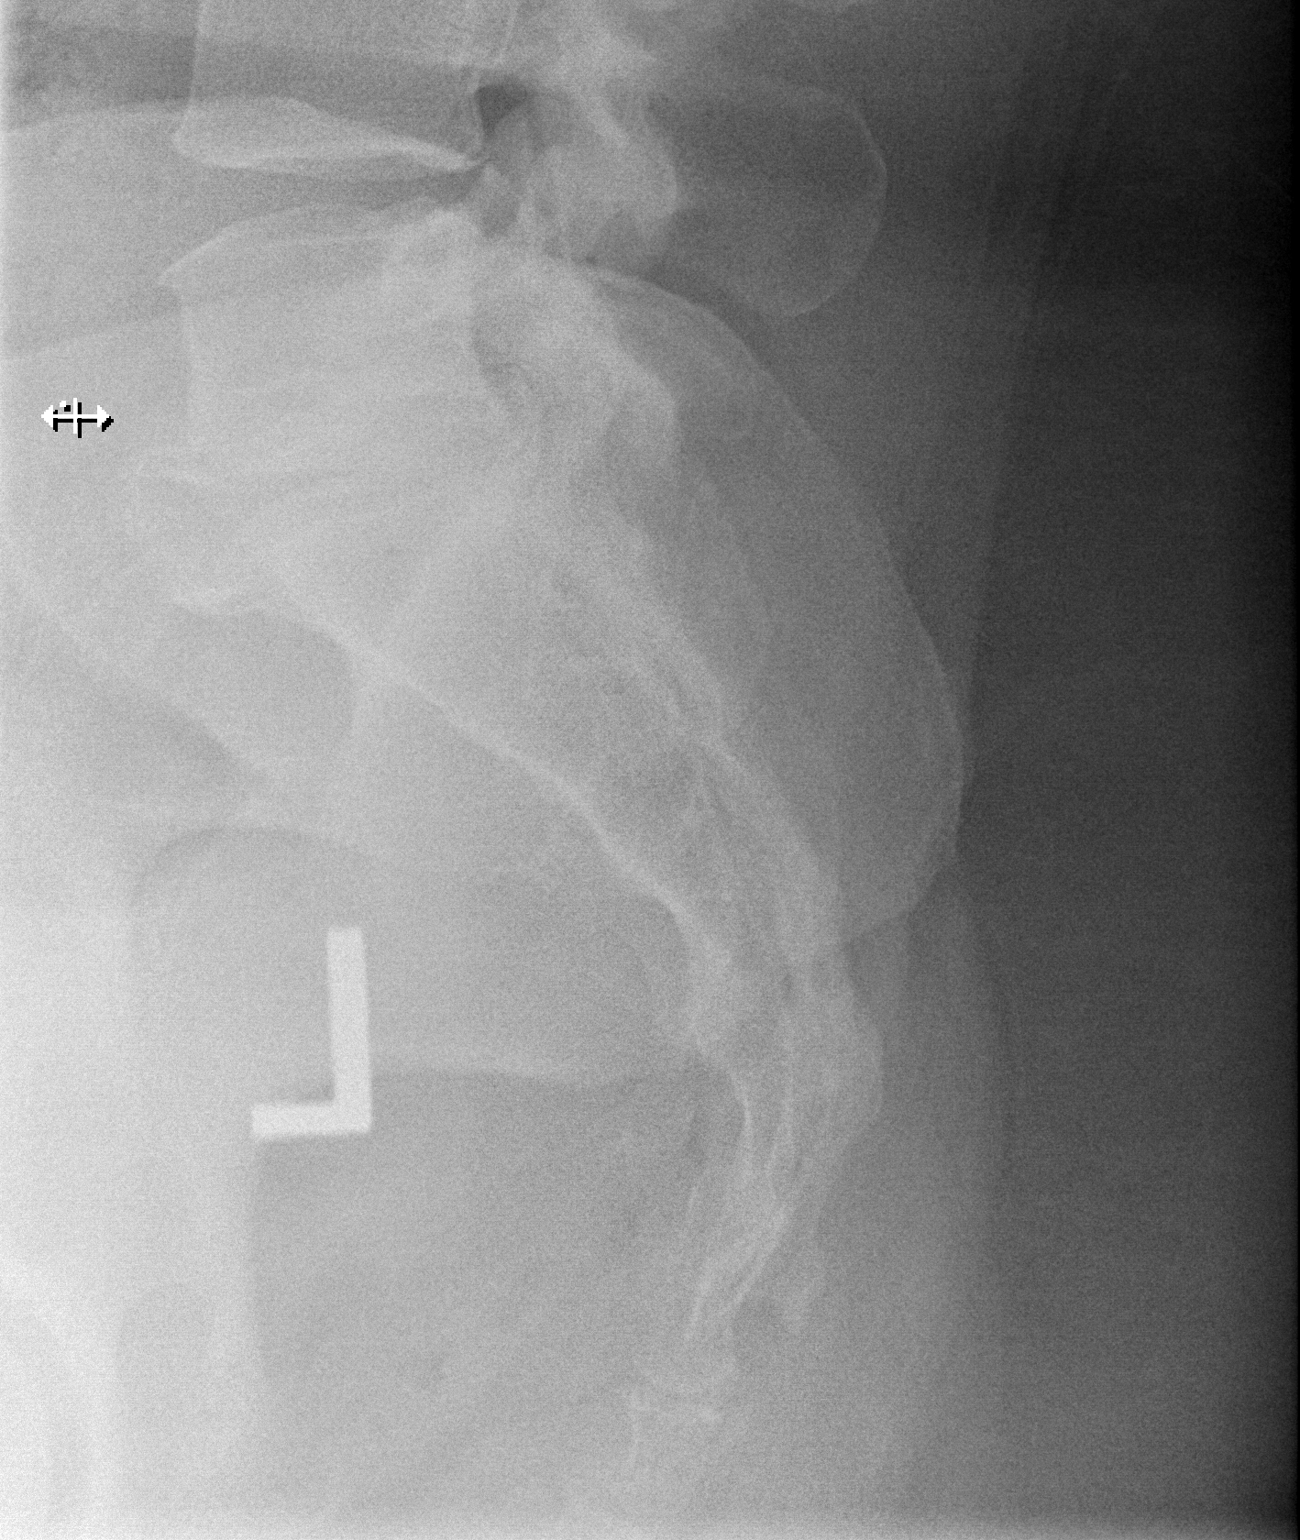

[3 of 3 positions shown; findings below may reference images not displayed]

FINDINGS: Frontal, lateral, and spot lumbosacral lateral images were obtained.
No fracture or spondylolisthesis. There is moderately severe disc
space narrowing at L5-S1, stable. Other disc spaces appear
unremarkable. There is facet osteoarthritic change at L5-S1
bilaterally.
IMPRESSION: Stable arthropathy at L5-S1.  No fracture or spondylolisthesis.

## 2022-02-03 IMAGING — CR DG THORACIC SPINE 3V
3 series · 3 of 3 positions shown · non-contrast
Comparison: October 10, 2012

CLINICAL DATA: Dorsalgia

EXAM:
THORACIC SPINE - 3 VIEWS

[t thoracic spine ap]
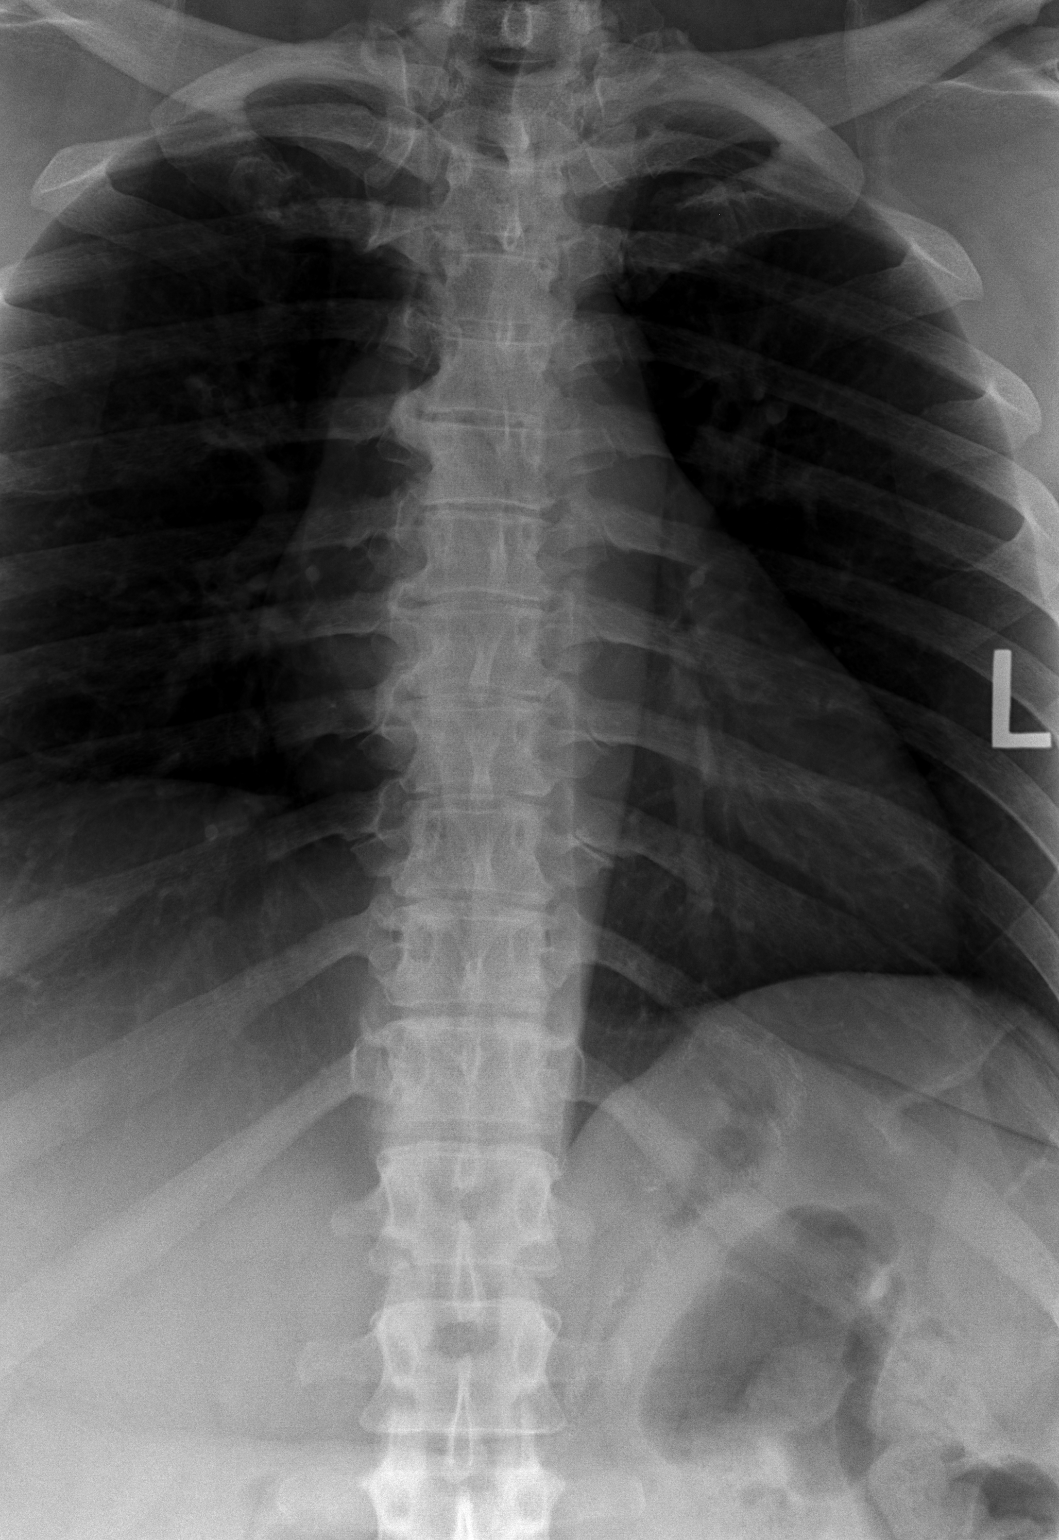

[t thoracic spine lat]
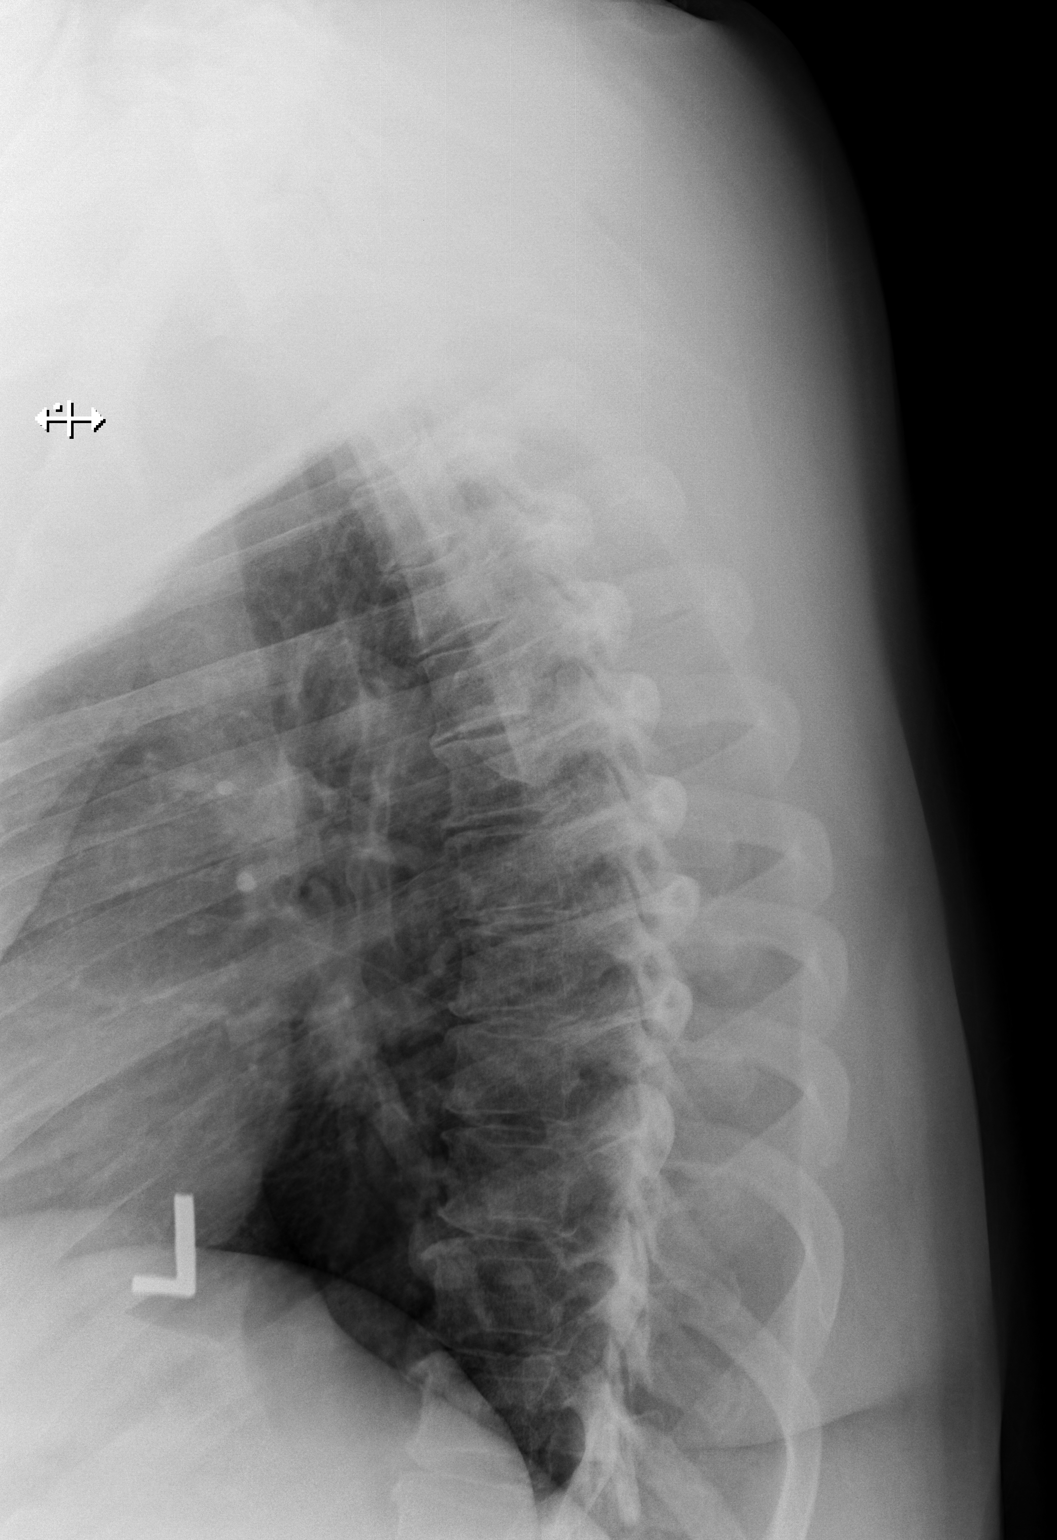

[t thoracic swimmers]
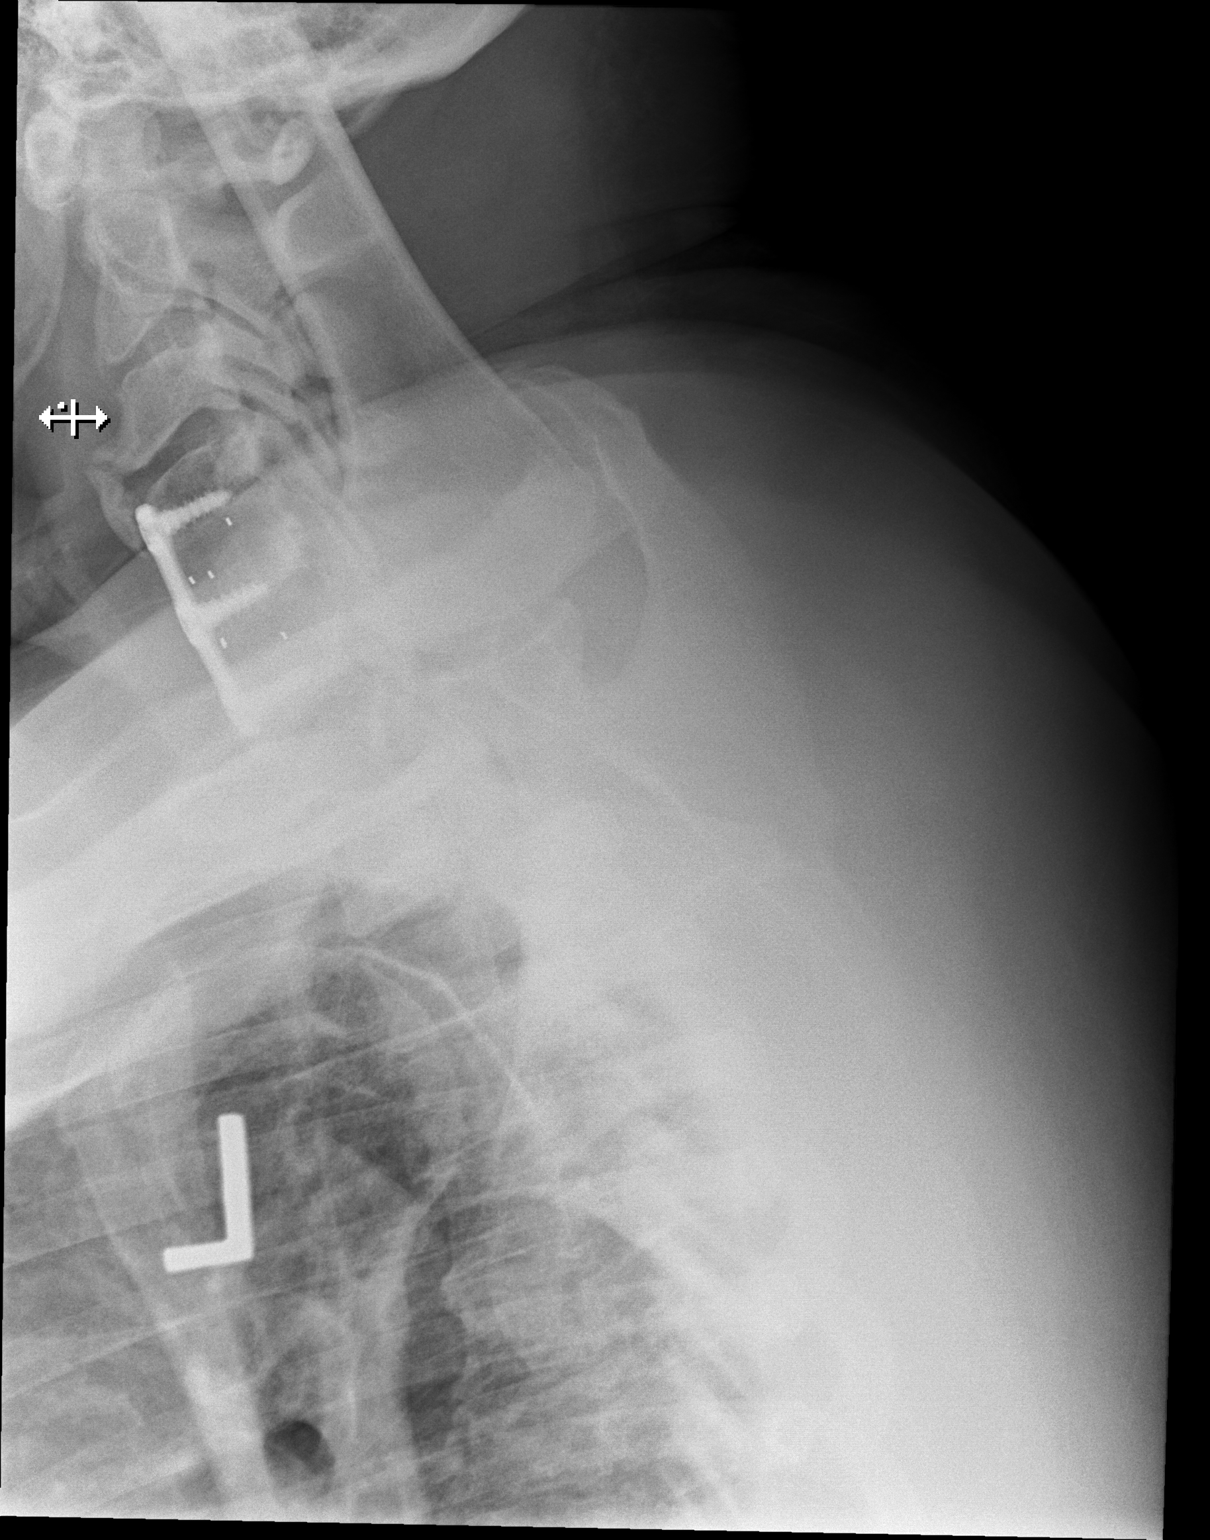

[3 of 3 positions shown; findings below may reference images not displayed]

FINDINGS: Frontal, lateral, and swimmer's views were obtained. There is no
fracture or spondylolisthesis. There is mild disc space narrowing at
several levels. No erosive change or paraspinous lesions. Visualized
lungs clear.
IMPRESSION: Osteoarthritic change at several levels. No fracture or
spondylolisthesis.

## 2023-02-24 ENCOUNTER — Ambulatory Visit: Payer: Managed Care, Other (non HMO) | Admitting: Dermatology

## 2023-07-05 ENCOUNTER — Ambulatory Visit
Admission: RE | Admit: 2023-07-05 | Discharge: 2023-07-05 | Disposition: A | Discharge status: Home / Self Care | Payer: Self-pay | Source: Ambulatory Visit | Attending: Family Medicine | Admitting: Family Medicine

## 2023-07-05 ENCOUNTER — Other Ambulatory Visit: Payer: Self-pay | Admitting: Family Medicine

## 2023-07-05 DIAGNOSIS — M25519 Pain in unspecified shoulder: Secondary | ICD-10-CM

## 2023-09-08 ENCOUNTER — Ambulatory Visit: Payer: Managed Care, Other (non HMO) | Admitting: Dermatology

## 2023-09-30 NOTE — Progress Notes (Addendum)
 COVID Vaccine Completed: yes  Date of COVID positive in last 90 days:  PCP - Comer Reid, NP Cardiologist - n/a  Chest x-ray - N/A EKG - 10/03/23 Epic/chart Stress Test - N/A ECHO - N/A Cardiac Cath - n/a Pacemaker/ICD device last checked:N/A Spinal Cord Stimulator:N/A  Bowel Prep - N/A  Sleep Study - yes CPAP - yes, most nights  Fasting Blood Sugar - preDM, not currently on meds Checks Blood Sugar _____ times a day  Last dose of GLP1 agonist-  N/A GLP1 instructions:  Do not take after     Last dose of SGLT-2 inhibitors-  N/A SGLT-2 instructions:  Do not take after     Blood Thinner Instructions: N/A Last dose:   Time: Aspirin Instructions:N/A Last Dose:  Activity level: Can go up a flight of stairs and perform activities of daily living without stopping and without symptoms of chest pain or shortness of breath.   Anesthesia review: BP at PAT 170/106 and 172/111. Patient stated she has not taken her BP medication yet today. She endorses severe shoulder pain. Denies any other symptoms. Will obtain EKG. She does have an appointment with PCP on 10/07/23. Instructed to monitor BP at home and let PCP know if it is staying elevated. Patient verbalized understanding.  Patient denies shortness of breath, fever, cough and chest pain at PAT appointment  Patient verbalized understanding of instructions that were given to them at the PAT appointment. Patient was also instructed that they will need to review over the PAT instructions again at home before surgery.

## 2023-09-30 NOTE — Patient Instructions (Addendum)
 SURGICAL WAITING ROOM VISITATION  Patients having surgery or a procedure may have no more than 2 support people in the waiting area - these visitors may rotate.    Children under the age of 59 must have an adult with them who is not the patient.  Visitors with respiratory illnesses are discouraged from visiting and should remain at home.  If the patient needs to stay at the hospital during part of their recovery, the visitor guidelines for inpatient rooms apply. Pre-op nurse will coordinate an appropriate time for 1 support person to accompany patient in pre-op.  This support person may not rotate.    Please refer to the [facility] website for the visitor guidelines for Inpatients (after your surgery is over and you are in a regular room).    Your procedure is scheduled on: 10/14/23   Report to [facility] Main Entrance    Report to admitting at 10:20 AM   Call this number if you have problems the morning of surgery [phone]   Do not eat food :After Midnight.   After Midnight you may have the following liquids until 9:35 AM DAY OF SURGERY  Water Non-Citrus Juices (without pulp, NO RED-Apple, White grape, White cranberry) Black Coffee (NO MILK/CREAM OR CREAMERS, sugar ok)  Clear Tea (NO MILK/CREAM OR CREAMERS, sugar ok) regular and decaf                             Plain Jell-O (NO RED)                                           Fruit ices (not with fruit pulp, NO RED)                                     Popsicles (NO RED)                                                               Sports drinks like Gatorade (NO RED)                 The day of surgery:  Drink ONE (1) Pre-Surgery G2 at 9:35 AM the morning of surgery. Drink in one sitting. Do not sip.  This drink was given to you during your hospital  pre-op appointment visit. Nothing else to drink after completing the  Pre-Surgery G2.          If you have questions, please contact your surgeon's  office.   FOLLOW BOWEL PREP AND ANY ADDITIONAL PRE OP INSTRUCTIONS YOU RECEIVED FROM YOUR SURGEON'S OFFICE!!!     Oral Hygiene is also important to reduce your risk of infection.                                    Remember - BRUSH YOUR TEETH THE MORNING OF SURGERY WITH YOUR REGULAR TOOTHPASTE  DENTURES WILL BE REMOVED PRIOR TO SURGERY PLEASE DO NOT APPLY Poly grip OR ADHESIVES!!!  Stop all vitamins and herbal supplements 7 days before surgery.   Take these medicines the morning of surgery with A SIP OF WATER: Tylenol , Duloxetine   Bring CPAP mask and tubing day of surgery.                              You may not have any metal on your body including hair pins, jewelry, and body piercing             Do not wear make-up, lotions, powders, perfumes, or deodorant  Do not wear nail polish including gel and S&S, artificial/acrylic nails, or any other type of covering on natural nails including finger and toenails. If you have artificial nails, gel coating, etc. that needs to be removed by a nail salon please have this removed prior to surgery or surgery may need to be canceled/ delayed if the surgeon/ anesthesia feels like they are unable to be safely monitored.   Do not shave  48 hours prior to surgery.    Do not bring valuables to the hospital. [facility] IS NOT             RESPONSIBLE   FOR VALUABLES.   Contacts, glasses, dentures or bridgework may not be worn into surgery.  DO NOT BRING YOUR HOME MEDICATIONS TO THE HOSPITAL. PHARMACY WILL DISPENSE MEDICATIONS LISTED ON YOUR MEDICATION LIST TO YOU DURING YOUR ADMISSION IN THE HOSPITAL!    Patients discharged on the day of surgery will not be allowed to drive home.  Someone NEEDS to stay with you for the first 24 hours after anesthesia.              Please read over the following fact sheets you were given: IF YOU HAVE QUESTIONS ABOUT YOUR PRE-OP INSTRUCTIONS PLEASE CALL [phone].   If you received a COVID test  during your pre-op visit  it is requested that you wear a mask when out in public, stay away from anyone that may not be feeling well and notify your surgeon if you develop symptoms. If you test positive for Covid or have been in contact with anyone that has tested positive in the last 10 days please notify you surgeon.    [facility] - Preparing for Surgery Before surgery, you can play an important role.  Because skin is not sterile, your skin needs to be as free of germs as possible.  You can reduce the number of germs on your skin by washing with CHG (chlorahexidine gluconate) soap before surgery.  CHG is an antiseptic cleaner which kills germs and bonds with the skin to continue killing germs even after washing. Please DO NOT use if you have an allergy to CHG or antibacterial soaps.  If your skin becomes reddened/irritated stop using the CHG and inform your nurse when you arrive at Short Stay. Do not shave (including legs and underarms) for at least 48 hours prior to the first CHG shower.  You may shave your face/neck.  Please follow these instructions carefully:  1.  Shower with CHG Soap the night before surgery and the  morning of surgery.  2.  If you choose to wash your hair, wash your hair first as usual with your normal  shampoo.  3.  After you shampoo, rinse your hair and body thoroughly to remove the shampoo.  4.  Use CHG as you would any other liquid soap.  You can apply chg directly to the skin and wash.  Gently with a scrungie or clean washcloth.  5.  Apply the CHG Soap to your body ONLY FROM THE NECK DOWN.   Do   not use on face/ open                           Wound or open sores. Avoid contact with eyes, ears mouth and   genitals (private parts).                       Wash face,  Genitals (private parts) with your normal soap.             6.  Wash thoroughly, paying special attention to the area where your    surgery  will be performed.  7.  Thoroughly rinse  your body with warm water from the neck down.  8.  DO NOT shower/wash with your normal soap after using and rinsing off the CHG Soap.                9.  Pat yourself dry with a clean towel.            10.  Wear clean pajamas.            11.  Place clean sheets on your bed the night of your first shower and do not  sleep with pets. Day of Surgery : Do not apply any lotions/deodorants the morning of surgery.  Please wear clean clothes to the hospital/surgery center.  FAILURE TO FOLLOW THESE INSTRUCTIONS MAY RESULT IN THE CANCELLATION OF YOUR SURGERY  PATIENT SIGNATURE_________________________________  NURSE SIGNATURE__________________________________  ________________________________________________________________________  Stacey Bray  An incentive spirometer is a tool that can help keep your lungs clear and active. This tool measures how well you are filling your lungs with each breath. Taking long deep breaths may help reverse or decrease the chance of developing breathing (pulmonary) problems (especially infection) following: A long period of time when you are unable to move or be active. BEFORE THE PROCEDURE  If the spirometer includes an indicator to show your best effort, your nurse or respiratory therapist will set it to a desired goal. If possible, sit up straight or lean slightly forward. Try not to slouch. Hold the incentive spirometer in an upright position. INSTRUCTIONS FOR USE  Sit on the edge of your bed if possible, or sit up as far as you can in bed or on a chair. Hold the incentive spirometer in an upright position. Breathe out normally. Place the mouthpiece in your mouth and seal your lips tightly around it. Breathe in slowly and as deeply as possible, raising the piston or the ball toward the top of the column. Hold your breath for 3-5 seconds or for as long as possible. Allow the piston or ball to fall to the bottom of the column. Remove the mouthpiece from  your mouth and breathe out normally. Rest for a few seconds and repeat Steps 1 through 7 at least 10 times every 1-2 hours when you are awake. Take your time and take a few normal breaths between deep breaths. The spirometer may include an indicator to show your best effort. Use the indicator as a goal to work toward during each repetition. After each set of 10 deep breaths, practice coughing to be sure your  lungs are clear. If you have an incision (the cut made at the time of surgery), support your incision when coughing by placing a pillow or rolled up towels firmly against it. Once you are able to get out of bed, walk around indoors and cough well. You may stop using the incentive spirometer when instructed by your caregiver.  RISKS AND COMPLICATIONS Take your time so you do not get dizzy or light-headed. If you are in pain, you may need to take or ask for pain medication before doing incentive spirometry. It is harder to take a deep breath if you are having pain. AFTER USE Rest and breathe slowly and easily. It can be helpful to keep track of a log of your progress. Your caregiver can provide you with a simple table to help with this. If you are using the spirometer at home, follow these instructions: SEEK MEDICAL CARE IF:  You are having difficultly using the spirometer. You have trouble using the spirometer as often as instructed. Your pain medication is not giving enough relief while using the spirometer. You develop fever of 100.5 F (38.1 C) or higher. SEEK IMMEDIATE MEDICAL CARE IF:  You cough up bloody sputum that had not been present before. You develop fever of 102 F (38.9 C) or greater. You develop worsening pain at or near the incision site. MAKE SURE YOU:  Understand these instructions. Will watch your condition. Will get help right away if you are not doing well or get worse. Document Released: 05/03/2006 Document Revised: 03/15/2011 Document Reviewed:  07/04/2006 [facility] Patient Information 2014 [place], [place].   ________________________________________________________________________

## 2023-10-03 ENCOUNTER — Encounter (HOSPITAL_COMMUNITY): Payer: Self-pay

## 2023-10-03 ENCOUNTER — Other Ambulatory Visit: Payer: Self-pay

## 2023-10-03 ENCOUNTER — Encounter (HOSPITAL_COMMUNITY)
Admission: RE | Admit: 2023-10-03 | Discharge: 2023-10-03 | Disposition: A | Discharge status: Home / Self Care | Source: Ambulatory Visit | Attending: Orthopedic Surgery | Admitting: Orthopedic Surgery

## 2023-10-03 VITALS — BP 170/106 | HR 82 | Temp 98.1°F | Resp 16 | Ht 67.0 in | Wt 343.0 lb

## 2023-10-03 DIAGNOSIS — Z01818 Encounter for other preprocedural examination: Secondary | ICD-10-CM | POA: Insufficient documentation

## 2023-10-03 DIAGNOSIS — I10 Essential (primary) hypertension: Secondary | ICD-10-CM | POA: Diagnosis not present

## 2023-10-03 HISTORY — DX: Anxiety disorder, unspecified: F41.9

## 2023-10-03 HISTORY — DX: Unspecified osteoarthritis, unspecified site: M19.90

## 2023-10-03 HISTORY — DX: Prediabetes: R73.03

## 2023-10-03 LAB — BASIC METABOLIC PANEL WITH GFR
Anion gap: 10 (ref 5–15)
BUN: 7 mg/dL (ref 6–20)
CO2: 27 mmol/L (ref 22–32)
Calcium: 9.2 mg/dL (ref 8.9–10.3)
Chloride: 105 mmol/L (ref 98–111)
Creatinine, Ser: 0.82 mg/dL (ref 0.44–1.00)
GFR, Estimated: >60 mL/min (ref >60)
Glucose, Bld: 107 mg/dL — ABNORMAL HIGH (ref 70–99)
Potassium: 3.8 mmol/L (ref 3.5–5.1)
Sodium: 142 mmol/L (ref 135–145)

## 2023-10-03 LAB — CBC
HCT: 37.9 % (ref 36.0–46.0)
Hemoglobin: 12.1 g/dL (ref 12.0–15.0)
MCH: 27.2 pg (ref 26.0–34.0)
MCHC: 31.9 g/dL (ref 30.0–36.0)
MCV: 85.2 fL (ref 80.0–100.0)
Platelets: 209 K/uL (ref 150–400)
RBC: 4.45 MIL/uL (ref 3.87–5.11)
RDW: 13.4 % (ref 11.5–15.5)
WBC: 7.1 K/uL (ref 4.0–10.5)
nRBC: 0 % (ref 0.0–0.2)

## 2023-10-12 ENCOUNTER — Encounter (HOSPITAL_COMMUNITY): Payer: Self-pay

## 2023-10-12 NOTE — Progress Notes (Signed)
 Case: [id number] Date/Time: 10/14/23 1130   Procedures:      ARTHROSCOPY, SHOULDER WITH DEBRIDEMENT (Right)     DECOMPRESSION, SUBACROMIAL SPACE (Right)     ARTHROSCOPY, SHOULDER, WITH ROTATOR CUFF REPAIR (Right) - Right shoulder arthroscopy with rotator cuff repair, debridement, biceps tenotomy, subacromial decompression and distal clavicle resection 120 please put knee case to follow   Anesthesia type: Choice   Pre-op diagnosis: Right shoulder rotator cuff tear, biceps tear, impingment, acromioclavicular osteoarthritis   Location: [facility] 09 / [facility]   Surgeons: Sharl Selinda Dover, MD       DISCUSSION: Stacey Bray is a 43 yo female with PMH of HTN, OSA (uses CPAP), s/p gastric sleeve, prediabetes, anemia, arthritis, obesity (BMI 53).  BP significant elevated at PAT visit on 9/29. Patient denied symptoms and reported no CP/SOB with activity. EKG obtained was NSR with artifact.  Seen by PCP on 10/07/23 and BP was more controlled (140/76). All other issues stable.   VS: BP (!) 170/106   Pulse 82   Temp 36.7 C (Oral)   Resp 16   Ht 5' 7 (1.702 m)   Wt (!) 155.6 kg   LMP 09/05/2023   SpO2 96%   BMI 53.72 kg/m   PROVIDERS: [facility]   LABS: Labs reviewed: Acceptable for surgery. (all labs ordered are listed, but only abnormal results are displayed)  Labs Reviewed  BASIC METABOLIC PANEL WITH GFR - Abnormal; Notable for the following components:      Result Value   Glucose, Bld 107 (*)    All other components within normal limits  CBC     IMAGES:   EKG 10/03/23:  NSR   CV:  Past Medical History:  Diagnosis Date   Anemia    Anxiety    Arthritis    Chronic back pain    Chronic neck pain    Hypertension    Pre-diabetes    Sleep apnea     Past Surgical History:  Procedure Laterality Date   ABDOMINAL SURGERY     BACK SURGERY     cervical spine   COLONOSCOPY WITH PROPOFOL  N/A 03/13/2019   Procedure: COLONOSCOPY  WITH PROPOFOL ;  Surgeon: Eda Iha, MD;  [facility];  Service: Gastroenterology;  Laterality: N/A;   ESOPHAGOGASTRODUODENOSCOPY (EGD) WITH PROPOFOL  N/A 03/13/2019   Procedure: ESOPHAGOGASTRODUODENOSCOPY (EGD) WITH PROPOFOL ;  Surgeon: Eda Iha, MD;  [facility];  Service: Gastroenterology;  Laterality: N/A;   LAPAROSCOPIC GASTRIC SLEEVE RESECTION  2016   POLYPECTOMY  03/13/2019   Procedure: POLYPECTOMY;  Surgeon: Eda Iha, MD;  [facility];  Service: Gastroenterology;;   TRANSANAL HEMORRHOIDAL DEARTERIALIZATION N/A 04/12/2019   Procedure: TRANSANAL HEMORRHOIDAL DEARTERIALIZATION;  Surgeon: Debby Hila, MD;  [facility];  Service: General;  Laterality: N/A;    MEDICATIONS:  acetaminophen  (TYLENOL ) 500 MG tablet   bisoprolol-hydrochlorothiazide  (ZIAC) 5-6.25 MG tablet   Calcium Carb-Cholecalciferol (CALCIUM + D3 PO)   Cyanocobalamin (VITAMIN B-12) 2500 MCG SUBL   DULoxetine (CYMBALTA) 60 MG capsule   ferrous sulfate 325 (65 FE) MG tablet   Multiple Vitamins-Calcium (ONE-A-DAY WOMENS PO)   Probiotic Product (PROBIOTIC PO)   Vitamin D, Ergocalciferol, (DRISDOL) 1.25 MG (50000 UNIT) CAPS capsule   vitamin E 180 MG (400 UNITS) capsule   No current facility-administered medications for this encounter.   Burnard CHRISTELLA Odis DEVONNA [facility]/Anesthesiology [facility] Phone [phone] 10/12/2023 11:02 AM

## 2023-10-12 NOTE — Anesthesia Preprocedure Evaluation (Signed)
 Anesthesia Evaluation  Patient identified by MRN, date of birth, ID band Patient awake    Reviewed: Allergy & Precautions, NPO status , Patient's Chart, lab work & pertinent test results  History of Anesthesia Complications Negative for: history of anesthetic complications  Airway Mallampati: II  TM Distance: >3 FB Neck ROM: Full   Comment: Previous grade I view with MAC 4, easy mask  Facial piercings (no metal), 1 in lip that patient will remove Dental  (+) Dental Advisory Given   Pulmonary neg shortness of breath, sleep apnea and Continuous Positive Airway Pressure Ventilation , neg COPD, neg recent URI   Pulmonary exam normal breath sounds clear to auscultation       Cardiovascular hypertension, (-) angina (-) Past MI, (-) Cardiac Stents and (-) CABG  Rhythm:Regular Rate:Normal     Neuro/Psych neg Seizures PSYCHIATRIC DISORDERS Anxiety Depression    Chronic back pain    GI/Hepatic Neg liver ROS,neg GERD  ,,S/p laparoscopic sleeve gastrectomy 2016   Endo/Other    Class 4 obesityPre-diabetes  Renal/GU negative Renal ROS     Musculoskeletal  (+) Arthritis ,    Abdominal  (+) + obese  Peds  Hematology negative hematology ROS (+) Lab Results      Component                Value               Date                      WBC                      7.1                 10/03/2023                HGB                      12.1                10/03/2023                HCT                      37.9                10/03/2023                MCV                      85.2                10/03/2023                PLT                      209                 10/03/2023              Anesthesia Other Findings   Reproductive/Obstetrics                              Anesthesia Physical Anesthesia Plan  ASA: 3  Anesthesia Plan: General   Post-op Pain Management: Regional block* and Tylenol  PO (pre-op)*    Induction: Intravenous  PONV Risk Score and Plan: 3 and Ondansetron , Dexamethasone , Midazolam  and Treatment may vary due to age or medical condition  Airway Management Planned: Oral ETT  Additional Equipment:   Intra-op Plan:   Post-operative Plan: Extubation in OR  Informed Consent: I have reviewed the patients History and Physical, chart, labs and discussed the procedure including the risks, benefits and alternatives for the proposed anesthesia with the patient or authorized representative who has indicated his/her understanding and acceptance.     Dental advisory given  Plan Discussed with: CRNA and Anesthesiologist  Anesthesia Plan Comments: (See PAT note from 9/29  Discussed potential risks of nerve blocks including, but not limited to, infection, bleeding, nerve damage, seizures, pneumothorax, respiratory depression, and potential failure of the block. Alternatives to nerve blocks discussed. All questions answered.  Risks of general anesthesia discussed including, but not limited to, sore throat, hoarse voice, chipped/damaged teeth, injury to vocal cords, nausea and vomiting, allergic reactions, lung infection, heart attack, stroke, and death. All questions answered. )         Anesthesia Quick Evaluation

## 2023-10-14 ENCOUNTER — Ambulatory Visit (HOSPITAL_COMMUNITY): Payer: Self-pay | Admitting: Physician Assistant

## 2023-10-14 ENCOUNTER — Ambulatory Visit (HOSPITAL_COMMUNITY): Payer: Self-pay | Admitting: Anesthesiology

## 2023-10-14 ENCOUNTER — Other Ambulatory Visit: Payer: Self-pay

## 2023-10-14 ENCOUNTER — Encounter (HOSPITAL_COMMUNITY)
Admission: RE | Disposition: A | Discharge status: Home / Self Care | Payer: Self-pay | Source: Ambulatory Visit | Attending: Orthopedic Surgery

## 2023-10-14 ENCOUNTER — Encounter (HOSPITAL_COMMUNITY): Payer: Self-pay | Admitting: Orthopedic Surgery

## 2023-10-14 ENCOUNTER — Ambulatory Visit (HOSPITAL_COMMUNITY)
Admission: RE | Admit: 2023-10-14 | Discharge: 2023-10-14 | Disposition: A | Discharge status: Home / Self Care | Source: Ambulatory Visit | Attending: Orthopedic Surgery | Admitting: Orthopedic Surgery

## 2023-10-14 DIAGNOSIS — S43491A Other sprain of right shoulder joint, initial encounter: Secondary | ICD-10-CM | POA: Insufficient documentation

## 2023-10-14 DIAGNOSIS — M75101 Unspecified rotator cuff tear or rupture of right shoulder, not specified as traumatic: Secondary | ICD-10-CM | POA: Insufficient documentation

## 2023-10-14 DIAGNOSIS — F418 Other specified anxiety disorders: Secondary | ICD-10-CM | POA: Diagnosis not present

## 2023-10-14 DIAGNOSIS — M7521 Bicipital tendinitis, right shoulder: Secondary | ICD-10-CM | POA: Insufficient documentation

## 2023-10-14 DIAGNOSIS — M19011 Primary osteoarthritis, right shoulder: Secondary | ICD-10-CM | POA: Diagnosis not present

## 2023-10-14 DIAGNOSIS — E6689 Other obesity not elsewhere classified: Secondary | ICD-10-CM | POA: Insufficient documentation

## 2023-10-14 DIAGNOSIS — M75121 Complete rotator cuff tear or rupture of right shoulder, not specified as traumatic: Secondary | ICD-10-CM | POA: Diagnosis not present

## 2023-10-14 DIAGNOSIS — F32A Depression, unspecified: Secondary | ICD-10-CM | POA: Diagnosis not present

## 2023-10-14 DIAGNOSIS — Z6841 Body Mass Index (BMI) 40.0 and over, adult: Secondary | ICD-10-CM | POA: Insufficient documentation

## 2023-10-14 DIAGNOSIS — X58XXXA Exposure to other specified factors, initial encounter: Secondary | ICD-10-CM | POA: Insufficient documentation

## 2023-10-14 DIAGNOSIS — G4730 Sleep apnea, unspecified: Secondary | ICD-10-CM | POA: Diagnosis not present

## 2023-10-14 DIAGNOSIS — M7551 Bursitis of right shoulder: Secondary | ICD-10-CM | POA: Diagnosis not present

## 2023-10-14 DIAGNOSIS — I10 Essential (primary) hypertension: Secondary | ICD-10-CM | POA: Insufficient documentation

## 2023-10-14 DIAGNOSIS — M65911 Unspecified synovitis and tenosynovitis, right shoulder: Secondary | ICD-10-CM | POA: Diagnosis not present

## 2023-10-14 DIAGNOSIS — Z9884 Bariatric surgery status: Secondary | ICD-10-CM | POA: Diagnosis not present

## 2023-10-14 DIAGNOSIS — M25811 Other specified joint disorders, right shoulder: Secondary | ICD-10-CM | POA: Diagnosis not present

## 2023-10-14 DIAGNOSIS — F419 Anxiety disorder, unspecified: Secondary | ICD-10-CM | POA: Insufficient documentation

## 2023-10-14 HISTORY — PX: SHOULDER ARTHROSCOPY WITH ROTATOR CUFF REPAIR: SHX5685

## 2023-10-14 HISTORY — PX: POSTERIOR LUMBAR FUSION 2 WITH HARDWARE REMOVAL: SHX7297

## 2023-10-14 HISTORY — PX: SUBACROMIAL DECOMPRESSION: SHX5174

## 2023-10-14 LAB — POCT PREGNANCY, URINE: Preg Test, Ur: NEGATIVE

## 2023-10-14 SURGERY — ARTHROSCOPY, SHOULDER WITH DEBRIDEMENT
Anesthesia: General | Laterality: Right

## 2023-10-14 MED ORDER — SUGAMMADEX SODIUM 200 MG/2ML IV SOLN
INTRAVENOUS | Status: AC
  Filled 2023-10-14: qty 2

## 2023-10-14 MED ORDER — FENTANYL CITRATE (PF) 50 MCG/ML IJ SOSY: 25.0000 ug | PREFILLED_SYRINGE | INTRAMUSCULAR | Status: DC | PRN

## 2023-10-14 MED ORDER — BUPIVACAINE HCL (PF) 0.5 % IJ SOLN
INTRAMUSCULAR | Status: DC | PRN
  Administered 2023-10-14: 10 mL via PERINEURAL

## 2023-10-14 MED ORDER — SUGAMMADEX SODIUM 200 MG/2ML IV SOLN
INTRAVENOUS | Status: AC
Start: 2023-10-14 — End: 2023-10-14
  Filled 2023-10-14: qty 2

## 2023-10-14 MED ORDER — EPINEPHRINE PF 1 MG/ML IJ SOLN
INTRAMUSCULAR | Status: AC
  Filled 2023-10-14: qty 1

## 2023-10-14 MED ORDER — EPINEPHRINE 1 MG/ML IJ SOLN
INTRAMUSCULAR | Status: DC | PRN
  Administered 2023-10-14: 1 mg

## 2023-10-14 MED ORDER — CEFAZOLIN SODIUM-DEXTROSE 3-4 GM/150ML-% IV SOLN
3.0000 g | INTRAVENOUS | Status: AC
  Administered 2023-10-14: 3 g via INTRAVENOUS
  Filled 2023-10-14: qty 150

## 2023-10-14 MED ORDER — LACTATED RINGERS IV SOLN: INTRAVENOUS | Status: DC

## 2023-10-14 MED ORDER — FENTANYL CITRATE (PF) 100 MCG/2ML IJ SOLN
INTRAMUSCULAR | Status: DC | PRN
  Administered 2023-10-14 (×2): 50 ug via INTRAVENOUS

## 2023-10-14 MED ORDER — PROPOFOL 10 MG/ML IV BOLUS
INTRAVENOUS | Status: AC
  Filled 2023-10-14: qty 20

## 2023-10-14 MED ORDER — SUGAMMADEX SODIUM 200 MG/2ML IV SOLN
INTRAVENOUS | Status: DC | PRN
Start: 2023-10-14 — End: 2023-10-14
  Administered 2023-10-14: 200 mg via INTRAVENOUS

## 2023-10-14 MED ORDER — ROCURONIUM BROMIDE 10 MG/ML (PF) SYRINGE
PREFILLED_SYRINGE | INTRAVENOUS | Status: AC
  Filled 2023-10-14: qty 10

## 2023-10-14 MED ORDER — ONDANSETRON 4 MG PO TBDP: 4.0000 mg | ORAL_TABLET | Freq: Three times a day (TID) | ORAL | 0 refills | Status: AC | PRN

## 2023-10-14 MED ORDER — DEXAMETHASONE SOD PHOSPHATE PF 10 MG/ML IJ SOLN
INTRAMUSCULAR | Status: DC | PRN
  Administered 2023-10-14: 10 mg via INTRAVENOUS

## 2023-10-14 MED ORDER — BUPIVACAINE HCL (PF) 0.25 % IJ SOLN
INTRAMUSCULAR | Status: AC
  Filled 2023-10-14: qty 30

## 2023-10-14 MED ORDER — BUPIVACAINE LIPOSOME 1.3 % IJ SUSP
INTRAMUSCULAR | Status: DC | PRN
  Administered 2023-10-14: 10 mL via PERINEURAL

## 2023-10-14 MED ORDER — OXYCODONE HCL 5 MG PO TABS: 5.0000 mg | ORAL_TABLET | Freq: Once | ORAL | Status: DC | PRN

## 2023-10-14 MED ORDER — DEXMEDETOMIDINE HCL IN NACL 80 MCG/20ML IV SOLN
INTRAVENOUS | Status: AC
  Filled 2023-10-14: qty 20

## 2023-10-14 MED ORDER — ONDANSETRON HCL 4 MG/2ML IJ SOLN
INTRAMUSCULAR | Status: AC
  Filled 2023-10-14: qty 2

## 2023-10-14 MED ORDER — MIDAZOLAM HCL 5 MG/5ML IJ SOLN
INTRAMUSCULAR | Status: DC | PRN
  Administered 2023-10-14: 2 mg via INTRAVENOUS

## 2023-10-14 MED ORDER — DEXMEDETOMIDINE HCL IN NACL 400 MCG/100ML IV SOLN
INTRAVENOUS | Status: DC | PRN
  Administered 2023-10-14: 12 ug via INTRAVENOUS
  Administered 2023-10-14: 8 ug via INTRAVENOUS

## 2023-10-14 MED ORDER — PROPOFOL 10 MG/ML IV BOLUS
INTRAVENOUS | Status: DC | PRN
  Administered 2023-10-14: 300 mg via INTRAVENOUS

## 2023-10-14 MED ORDER — CHLORHEXIDINE GLUCONATE 0.12 % MT SOLN
15.0000 mL | Freq: Once | OROMUCOSAL | Status: AC
  Administered 2023-10-14: 15 mL via OROMUCOSAL

## 2023-10-14 MED ORDER — SODIUM CHLORIDE 0.9 % IR SOLN
Status: DC | PRN
Start: 2023-10-14 — End: 2023-10-14
  Administered 2023-10-14: 7000 mL

## 2023-10-14 MED ORDER — FENTANYL CITRATE (PF) 100 MCG/2ML IJ SOLN
INTRAMUSCULAR | Status: AC
  Filled 2023-10-14: qty 2

## 2023-10-14 MED ORDER — MIDAZOLAM HCL 2 MG/2ML IJ SOLN
1.0000 mg | Freq: Once | INTRAMUSCULAR | Status: AC
  Administered 2023-10-14: 2 mg via INTRAVENOUS

## 2023-10-14 MED ORDER — ORAL CARE MOUTH RINSE: 15.0000 mL | Freq: Once | OROMUCOSAL | Status: AC

## 2023-10-14 MED ORDER — PROPOFOL 500 MG/50ML IV EMUL
INTRAVENOUS | Status: AC
  Filled 2023-10-14: qty 50

## 2023-10-14 MED ORDER — MIDAZOLAM HCL 2 MG/2ML IJ SOLN
INTRAMUSCULAR | Status: AC
  Filled 2023-10-14: qty 2

## 2023-10-14 MED ORDER — ROCURONIUM BROMIDE 100 MG/10ML IV SOLN
INTRAVENOUS | Status: DC | PRN
  Administered 2023-10-14: 60 mg via INTRAVENOUS

## 2023-10-14 MED ORDER — FENTANYL CITRATE (PF) 50 MCG/ML IJ SOSY
50.0000 ug | PREFILLED_SYRINGE | Freq: Once | INTRAMUSCULAR | Status: AC
  Administered 2023-10-14: 50 ug via INTRAVENOUS

## 2023-10-14 MED ORDER — AMISULPRIDE (ANTIEMETIC) 5 MG/2ML IV SOLN: 10.0000 mg | Freq: Once | INTRAVENOUS | Status: DC | PRN

## 2023-10-14 MED ORDER — OXYCODONE HCL 5 MG/5ML PO SOLN: 5.0000 mg | Freq: Once | ORAL | Status: DC | PRN

## 2023-10-14 MED ORDER — OXYCODONE HCL 5 MG PO TABS: 5.0000 mg | ORAL_TABLET | ORAL | 0 refills | Status: AC | PRN

## 2023-10-14 MED ORDER — ACETAMINOPHEN 500 MG PO TABS
1000.0000 mg | ORAL_TABLET | Freq: Once | ORAL | Status: AC
  Administered 2023-10-14: 1000 mg via ORAL
  Filled 2023-10-14: qty 2

## 2023-10-14 SURGICAL SUPPLY — 45 items
ANCHOR SUT FBRTK 2.6 SOFT 1.7 (Anchor) IMPLANT
ANCHOR SUT FBRTK 2.6 SP1.7 TAP (Anchor) IMPLANT
ANCHOR SWIVELOCK SP KL 4.75 (Anchor) IMPLANT
BAG COUNTER SPONGE SURGICOUNT (BAG) ×1 IMPLANT
BURR OVAL 8 FLU 4.0X13 (MISCELLANEOUS) IMPLANT
CANNULA PASSPORT BUTTON 8X4 (CANNULA) IMPLANT
CANNULA TWIST IN 8.25X7CM (CANNULA) IMPLANT
CONNECTOR 5 IN 1 STRAIGHT STRL (MISCELLANEOUS) ×1 IMPLANT
COVER FOOTSWITCH UNIV (MISCELLANEOUS) ×1 IMPLANT
CUTTER BONE 4.0MM X 13CM (MISCELLANEOUS) ×1 IMPLANT
DRAPE INCISE IOBAN 66X45 STRL (DRAPES) ×1 IMPLANT
DRAPE STERI 35X30 U-POUCH (DRAPES) ×1 IMPLANT
DRAPE SURG 17X23 STRL (DRAPES) ×1 IMPLANT
DRAPE SURG ORHT 6 SPLT 77X108 (DRAPES) ×2 IMPLANT
DRAPE U-SHAPE 47X51 STRL (DRAPES) ×1 IMPLANT
DRSG TEGADERM 4X4.75 (GAUZE/BANDAGES/DRESSINGS) IMPLANT
DURAPREP 26ML APPLICATOR (WOUND CARE) ×1 IMPLANT
ELECT PENCIL ROCKER SW 15FT (MISCELLANEOUS) IMPLANT
ELECT REM PT RETURN 15FT ADLT (MISCELLANEOUS) IMPLANT
FILTER STRAW (MISCELLANEOUS) IMPLANT
GAUZE PAD ABD 8X10 STRL (GAUZE/BANDAGES/DRESSINGS) ×3 IMPLANT
GAUZE SPONGE 4X4 12PLY STRL (GAUZE/BANDAGES/DRESSINGS) ×1 IMPLANT
GLOVE BIO SURGEON STRL SZ7.5 (GLOVE) ×2 IMPLANT
GLOVE BIOGEL PI IND STRL 8 (GLOVE) ×2 IMPLANT
GOWN STRL REUS W/ TWL XL LVL3 (GOWN DISPOSABLE) ×2 IMPLANT
KIT ANCHOR FBRTK 2.6 STR (KITS) IMPLANT
KIT BASIN OR (CUSTOM PROCEDURE TRAY) ×1 IMPLANT
KIT TURNOVER KIT A (KITS) ×1 IMPLANT
MANIFOLD NEPTUNE II (INSTRUMENTS) ×1 IMPLANT
NDL HD SCORPION MEGA LOADER (NEEDLE) IMPLANT
NDL SAFETY ECLIPSE 18X1.5 (NEEDLE) IMPLANT
PACK ARTHROSCOPY WL (CUSTOM PROCEDURE TRAY) ×1 IMPLANT
SLEEVE ARM SUSPENSION SYSTEM (MISCELLANEOUS) ×1 IMPLANT
SLING ARM FOAM STRAP LRG (SOFTGOODS) ×1 IMPLANT
SLING ARM FOAM STRAP MED (SOFTGOODS) IMPLANT
SLING S3 LATERAL DISP (MISCELLANEOUS) IMPLANT
STRIP CLOSURE SKIN 1/2X4 (GAUZE/BANDAGES/DRESSINGS) IMPLANT
SUT FIBERSNARE 2 CLSD LOOP (SUTURE) IMPLANT
SUT MNCRL AB 3-0 PS2 27 (SUTURE) ×1 IMPLANT
SUT PDS AB 0 CT1 36 (SUTURE) IMPLANT
SYR 27GX1/2 1ML LL SAFETY (SYRINGE) IMPLANT
TOWEL OR 17X26 10 PK STRL BLUE (TOWEL DISPOSABLE) ×1 IMPLANT
TUBING ARTHROSCOPY IRRIG 16FT (MISCELLANEOUS) ×1 IMPLANT
TUBING CONNECTING 10 (TUBING) ×1 IMPLANT
WAND ABLATOR APOLLO I90 (BUR) ×1 IMPLANT

## 2023-10-14 NOTE — Op Note (Signed)
 Date of Surgery: 10/14/2023  INDICATIONS: Stacey Bray is a 43 y.o.-year-old female with a right shoulder rotator cuff tear, biceps long head tear, impingement in the subacromial space as well as symptomatic acromioclavicular joint arthritis;  The patient did consent to the procedure after discussion of the risks and benefits.  PREOPERATIVE DIAGNOSIS:  1.  Right shoulder full-thickness rotator cuff tear 2.  Right shoulder biceps tendinitis 3.  Right shoulder degenerative labral tearing anterior, superior, posterior 4.  Right shoulder subacromial impingement 5.  Right shoulder acromioclavicular arthropathy  POSTOPERATIVE DIAGNOSIS: Same.  PROCEDURE:  Right shoulder arthroscopic extensive debridement anterior labrum, superior labrum, rotator interval, subacromial bursa, biceps tenotomy Right shoulder arthroscopic rotator cuff repair Right shoulder arthroscopic subacromial decompression with coracoacromial ligament release Right shoulder arthroscopic distal clavicle resection  SURGEON: Selinda SHAUNNA Gosling, M.D.  ASSIST: Dayle Moores, PA-C  Assistant attestation:  PA Mcclung scrubbed and present for the entire procedure..  ANESTHESIA:  general, interscalene block with Exparel   IV FLUIDS AND URINE: See anesthesia.  ESTIMATED BLOOD LOSS: 15 mL.  IMPLANTS: 2 Arthrex rotator cuff 2.6 mm fiber tack anchors for medial row 2 Arthrex 4.75 mm swivel lock anchor, self punching for lateral row  DRAINS: None  COMPLICATIONS: None.  DESCRIPTION OF PROCEDURE: The patient was brought to the operating room and placed supine on the operating table.  The patient had been signed prior to the procedure and this was documented. The patient had the anesthesia placed by the anesthesiologist.  A time-out was performed to confirm that this was the correct patient, site, side and location. The patient did receive antibiotics prior to the incision and was re-dosed during the procedure as needed at indicated  intervals.  A tourniquet was not placed.  The patient had the operative extremity prepped and draped in the standard surgical fashion.      After obtaining informed consent the patient was brought to the operating table and underwent satisfactory anesthesia. An exam under anesthesia revealed full range of motion. She was placed in the left lateral decubitus position with an axillary roll and all bony prominences properly padded. A standard surgical timeout was performed. She was placed in 10 pounds of gentle in-line suspension.  Standard posterior and anterior superior portals were established. A diagnostic evaluation of the glenohumeral joint was performed. The biceps tendon was markedly synovitic and partially torn.  There was less than 10% upper border tearing of the subscapularis but no medial retraction.  There was moderate synovitis.  Glenohumeral joint had grade 0 changes of the humeral head but superiorly there was grade 2 and 3 changes as well as a superior rim osteophyte adjacent to degenerative labral tearing anterior, superior and posterior.  The infraspinatus and teres minor were intact without tears.  The supraspinatus had a full-thickness tear crescent-shaped.  There were no loose bodies appreciated.     While performing the intra-articular portion of the procedure we established a mid glenoid working portal.  This was established via spinal needle localization.  We then introduced the motorized shaver into the joint and performed extensive debridement of the joint removing unstable tissue of the anterior labrum, superior labrum and posterior labrum.  Next, we introduced the radiofrequency wand and performed biceps tenotomy.   The arthroscope was inserted in the subacromial space and an additional lateral portal was established. An acromioplasty performed nicely decompressing the subacromial space with a motorized burr.  We also peeled the coracoacromial ligament off of the anterior lateral  corner of the acromion to  complete the subacromial decompression and a cutting block technique.    Bursitis in the subacromial space was removed as well as releases were performed on the bursal surface of the rotator cuff.  We then prepared for the rotator cuff repair.  This was a crescent shaped tear that measured 2 cm from anterior to posterior and 1.5 cm from medial to lateral.  There was some enthesophyte noted at the tuberosity consistent with may be more chronicity to this tear.  We did level that down with the motorized shaver.  We then biologically prepared the greater tuberosity with the same motorized shaver to elicit petechial bleeding.  Tendon mobilization was good.  Next, we placed 2 medial row anchors at the articular margin.  These were rotator cuff Arthrex fiber tack soft anchors.  We then passed the fiber tapes at the myotendinous junction with an antegrade suture scorpion.  Next, we divided these for our lateral row fixation.  Double row repair was obtained by securing all sutures to two 4.75 swivel lock anchors. The bone was of good quality. This completed the rotator cuff repair.  Lastly we performed our distal clavicle resection.  While viewing from the lateral portal and working from the mid glenoid portal we performed a subperiosteal dissection of the medial acromion as well as lateral clavicle.  She did have an understanding osteophyte on the medial acromion as well as sclerosis of the distal clavicle.  We then resected approximately 1 cm total of bone from the medial acromion and distal clavicle.  This nicely decompressed the acromioclavicular joint.  The arthroscope was then removed and portals closed with 3-0 Monocryl in standard fashion followed by a sterile occlusive dressing Polar Care ice sleeve and a slingshot sling. The patient was sent to recovery in stable condition and tolerated the procedure well  POSTOPERATIVE PLAN:  Stacey Bray will be in her sling postoperatively for  approximately 5 to 6 weeks.  She will be on the less than 3 cm protocol.  She will be discharged home today from PACU.  Will plan on seeing her back in the office in 2 weeks.

## 2023-10-14 NOTE — Anesthesia Procedure Notes (Signed)
 Procedure Name: Intubation Date/Time: 10/14/2023 12:33 PM  Performed by: [place], Corean BROCKS, CRNAPre-anesthesia Checklist: Patient identified, Emergency Drugs available, Suction available and Patient being monitored Patient Re-evaluated:Patient Re-evaluated prior to induction Oxygen Delivery Method: Circle system utilized Preoxygenation: Pre-oxygenation with 100% oxygen Induction Type: IV induction Ventilation: Mask ventilation without difficulty Laryngoscope Size: Mac and 4 Grade View: Grade I Tube type: Oral Tube size: 7.0 mm Number of attempts: 1 Airway Equipment and Method: Stylet and Oral airway Placement Confirmation: ETT inserted through vocal cords under direct vision, positive ETCO2 and breath sounds checked- equal and bilateral Secured at: 22 cm Tube secured with: Tape Dental Injury: Teeth and Oropharynx as per pre-operative assessment  Comments: Intubation by Lyle, paramedic student with consent of patient prior to procedure

## 2023-10-14 NOTE — Anesthesia Procedure Notes (Signed)
 Anesthesia Regional Block: Interscalene brachial plexus block   Pre-Anesthetic Checklist: , timeout performed,  Correct Patient, Correct Site, Correct Laterality,  Correct Procedure, Correct Position, site marked,  Risks and benefits discussed,  Surgical consent,  Pre-op evaluation,  At surgeon's request and post-op pain management  Laterality: Right  Prep: chloraprep       Needles:  Injection technique: Single-shot  Needle Type: Echogenic Stimulator Needle     Needle Length: 9cm  Needle Gauge: 21     Additional Needles:   Procedures:,,,, ultrasound used (permanent image in chart),,    Narrative:  Start time: 10/14/2023 11:10 AM End time: 10/14/2023 11:14 AM Injection made incrementally with aspirations every 5 mL.  Performed by: Personally  Anesthesiologist: Peggye Delon Brunswick, MD  Additional Notes: Discussed risks and benefits of nerve block including, but not limited to, prolonged and/or permanent nerve injury involving sensory and/or motor function. Monitors were applied and a time-out was performed. The nerve and associated structures were visualized under ultrasound guidance. After negative aspiration, local anesthetic was slowly injected around the nerve. There was no evidence of high pressure during the procedure. There were no paresthesias. VSS remained stable and the patient tolerated the procedure well.

## 2023-10-14 NOTE — Brief Op Note (Signed)
 10/14/2023  1:56 PM  PATIENT:  Stacey Bray  43 y.o. female  PRE-OPERATIVE DIAGNOSIS:  Right shoulder rotator cuff tear, biceps tear, impingment, acromioclavicular osteoarthritis  POST-OPERATIVE DIAGNOSIS:  Right shoulder rotator cuff tear, biceps tear, impingment, acromioclavicular osteoarthritis  PROCEDURE:  Procedure(s) with comments: ARTHROSCOPY, SHOULDER WITH DEBRIDEMENT (Right) DECOMPRESSION, SUBACROMIAL SPACE (Right) ARTHROSCOPY, SHOULDER, WITH ROTATOR CUFF REPAIR (Right) - Right shoulder arthroscopy with rotator cuff repair, debridement, biceps tenotomy, subacromial decompression and distal clavicle resection  SURGEON:  Surgeons and Role:    * Sharl Selinda Dover, MD - Primary  PHYSICIAN ASSISTANT: Dayle Moores, PA-C  ANESTHESIA:   regional and general  EBL:  15 cc  BLOOD ADMINISTERED:none  DRAINS: none   LOCAL MEDICATIONS USED:  NONE  SPECIMEN:  No Specimen  DISPOSITION OF SPECIMEN:  N/A  COUNTS:  YES  TOURNIQUET:  * No tourniquets in log *  DICTATION: .Note written in EPIC  PLAN OF CARE: Discharge to home after PACU  PATIENT DISPOSITION:  PACU - hemodynamically stable.   Delay start of Pharmacological VTE agent (>24hrs) due to surgical blood loss or risk of bleeding: not applicable

## 2023-10-14 NOTE — Transfer of Care (Signed)
 Immediate Anesthesia Transfer of Care Note  Patient: Stacey Bray  Procedure(s) Performed: ARTHROSCOPY, SHOULDER WITH DEBRIDEMENT (Right) DECOMPRESSION, SUBACROMIAL SPACE (Right) ARTHROSCOPY, SHOULDER, WITH ROTATOR CUFF REPAIR (Right)  Patient Location: PACU  Anesthesia Type:General  Level of Consciousness: awake and confused  Airway & Oxygen Therapy: Patient Spontanous Breathing and Patient connected to nasal cannula oxygen  Post-op Assessment: Report given to RN and Post -op Vital signs reviewed and stable  Post vital signs: Reviewed and stable  Last Vitals:  Vitals Value Taken Time  BP 155/95 10/14/23 14:16  Temp    Pulse 75 10/14/23 14:22  Resp 20 10/14/23 14:22  SpO2 99 % 10/14/23 14:22  Vitals shown include unfiled device data.  Last Pain:  Vitals:   10/14/23 1115  TempSrc:   PainSc: 0-No pain         Complications: No notable events documented.

## 2023-10-14 NOTE — H&P (Signed)
 ORTHOPAEDIC H&P  REQUESTING PHYSICIAN: Sharl Selinda Dover, MD  PCP:  [facility]  Chief Complaint: Right shoulder pain  HPI: Stacey Bray is a 43 y.o. female who complains of right shoulder pain and weakness.  She does have a rotator cuff tear as well as symptomatic AC joint.  Here today for arthroscopic assisted surgery.  Past Medical History:  Diagnosis Date   Anemia    Anxiety    Arthritis    Chronic back pain    Chronic neck pain    Hypertension    Pre-diabetes    Sleep apnea    Past Surgical History:  Procedure Laterality Date   ABDOMINAL SURGERY     BACK SURGERY     cervical spine   COLONOSCOPY WITH PROPOFOL  N/A 03/13/2019   Procedure: COLONOSCOPY WITH PROPOFOL ;  Surgeon: Eda Iha, MD;  [facility];  Service: Gastroenterology;  Laterality: N/A;   ESOPHAGOGASTRODUODENOSCOPY (EGD) WITH PROPOFOL  N/A 03/13/2019   Procedure: ESOPHAGOGASTRODUODENOSCOPY (EGD) WITH PROPOFOL ;  Surgeon: Eda Iha, MD;  [facility];  Service: Gastroenterology;  Laterality: N/A;   LAPAROSCOPIC GASTRIC SLEEVE RESECTION  2016   POLYPECTOMY  03/13/2019   Procedure: POLYPECTOMY;  Surgeon: Eda Iha, MD;  [facility];  Service: Gastroenterology;;   TRANSANAL HEMORRHOIDAL DEARTERIALIZATION N/A 04/12/2019   Procedure: TRANSANAL HEMORRHOIDAL DEARTERIALIZATION;  Surgeon: Debby Hila, MD;  Location: [facility];  Service: General;  Laterality: N/A;   Social History   Socioeconomic History   Marital status: Married    Spouse name: Not on file   Number of children: Not on file   Years of education: Not on file   Highest education level: Not on file  Occupational History   Not on file  Tobacco Use   Smoking status: Never   Smokeless tobacco: Never  Vaping Use   Vaping status: Never Used  Substance and Sexual Activity   Alcohol use: No   Drug use: No   Sexual activity: Yes    Birth control/protection: None   Other Topics Concern   Not on file  Social History Narrative   Not on file   Social Drivers of Health   Financial Resource Strain: Low Risk  (02/18/2023)   Received from [facility]   Overall Financial Resource Strain (CARDIA)    Difficulty of Paying Living Expenses: Not hard at all  Food Insecurity: No Food Insecurity (02/18/2023)   Received from [facility]   Hunger Vital Sign    Within the past 12 months, the food you bought just didn't last and you didn't have money to get more.: Never true    Within the past 12 months, you worried that your food would run out before you got the money to buy more.: Never true  Transportation Needs: No Transportation Needs (02/18/2023)   Received from [facility] - Transportation    Lack of Transportation (Non-Medical): No    Lack of Transportation (Medical): No  Physical Activity: Not on file  Stress: Not on file  Social Connections: Not on file   Family History  Problem Relation Age of Onset   Colon cancer Neg Hx    Esophageal cancer Neg Hx    Pancreatic cancer Neg Hx    Stomach cancer Neg Hx    Liver disease Neg Hx    Allergies  Allergen Reactions   Doxycycline  Monohydrate Itching and Swelling   Latex Itching and Rash   Lisinopril Swelling    Patient  is in the ED today because of swelling of the mouth and part of her tongue; no breathing impairment noted   Levofloxacin Rash   Prior to Admission medications   Medication Sig Start Date End Date Taking? Authorizing Provider  acetaminophen  (TYLENOL ) 500 MG tablet Take 1 tablet (500 mg total) by mouth every 6 (six) hours as needed. Patient taking differently: Take 1,000 mg by mouth every 6 (six) hours as needed for mild pain (pain score 1-3) or headache. 12/18/16  Yes Nivia Colon, PA-C  bisoprolol-hydrochlorothiazide  (ZIAC) 5-6.25 MG tablet Take 1 tablet by mouth daily. 10/25/18  Yes [provider]  Calcium Carb-Cholecalciferol (CALCIUM + D3 PO) Take 600 mg by  mouth 2 (two) times daily.    Yes [provider]  Cyanocobalamin (VITAMIN B-12) 2500 MCG SUBL Place 2,500 mcg under the tongue daily.   Yes [provider]  DULoxetine (CYMBALTA) 60 MG capsule Take 60 mg by mouth daily. 07/05/23  Yes [provider]  ferrous sulfate 325 (65 FE) MG tablet Take 325 mg by mouth daily with breakfast.   Yes [provider]  Multiple Vitamins-Calcium (ONE-A-DAY WOMENS PO) Take 1 tablet by mouth daily. Centrum   Yes [provider]  Probiotic Product (PROBIOTIC PO) Take 1 capsule by mouth daily.   Yes [provider]  Vitamin D, Ergocalciferol, (DRISDOL) 1.25 MG (50000 UNIT) CAPS capsule Take 50,000 Units by mouth once a week. 07/05/23 12/14/23 Yes [provider]  vitamin E 180 MG (400 UNITS) capsule Take 400 Units by mouth daily.   Yes [provider]   No results found.  Positive ROS: All other systems have been reviewed and were otherwise negative with the exception of those mentioned in the HPI and as above.  Physical Exam: General: Alert, no acute distress Cardiovascular: No pedal edema Respiratory: No cyanosis, no use of accessory musculature GI: No organomegaly, abdomen is soft and non-tender Skin: No lesions in the area of chief complaint Neurologic: Sensation intact distally Psychiatric: Patient is competent for consent with normal mood and affect Lymphatic: No axillary or cervical lymphadenopathy  MUSCULOSKELETAL: Right upper extremity is warm and well-perfused.  No open wounds or lesions.  Neurovascular intact.  Assessment: 1.  Right shoulder full-thickness rotator cuff tear 2.  Right shoulder biceps tendinitis 3.  Right shoulder impingement 4.  Right shoulder acromioclavicular joint arthritis  Plan: Plan to proceed today with arthroscopic assisted surgery on the right shoulder with rotator cuff repair, biceps tenotomy as well as debridement and subacromial decompression with  distal clavicle resection.  We again discussed the risk and benefits of the procedure including but not limited to bleeding, infection, damage to surrounding nerves and vessels, stiffness, failure of repairs, pain, risk of anesthesia and the risk of further surgery down the road.  She has provided informed consent.  Plan for discharge home postop from PACU.    Selinda Belvie Gosling, MD Cell [phone]    10/14/2023 10:29 AM

## 2023-10-15 NOTE — Anesthesia Postprocedure Evaluation (Signed)
 Anesthesia Post Note  Patient: Stacey Bray  Procedure(s) Performed: ARTHROSCOPY, SHOULDER WITH DEBRIDEMENT (Right) DECOMPRESSION, SUBACROMIAL SPACE (Right) ARTHROSCOPY, SHOULDER, WITH ROTATOR CUFF REPAIR (Right)     Patient location during evaluation: PACU Anesthesia Type: General Level of consciousness: awake Pain management: pain level controlled Vital Signs Assessment: post-procedure vital signs reviewed and stable Respiratory status: spontaneous breathing, nonlabored ventilation and respiratory function stable Cardiovascular status: blood pressure returned to baseline and stable Postop Assessment: no apparent nausea or vomiting Anesthetic complications: no   No notable events documented.  Last Vitals:  Vitals:   10/14/23 1545 10/14/23 1604  BP: (!) 150/93 135/80  Pulse: 69 73  Resp: 15   Temp:  36.5 C  SpO2: 92% (!) 88%    Last Pain:  Vitals:   10/14/23 1604  TempSrc:   PainSc: 0-No pain                 Delon Aisha Arch

## 2023-10-18 ENCOUNTER — Encounter (HOSPITAL_COMMUNITY): Payer: Self-pay | Admitting: Orthopedic Surgery

## 2024-01-10 ENCOUNTER — Encounter (HOSPITAL_COMMUNITY): Payer: Self-pay | Admitting: Orthopedic Surgery
